# Patient Record
Sex: Female | Born: 1950
Health system: Southern US, Community
[De-identification: ages and names within clinical notes are randomized; demographics above are authoritative.]

## PROBLEM LIST (undated history)

## (undated) DIAGNOSIS — Z86018 Personal history of other benign neoplasm: Secondary | ICD-10-CM

## (undated) DIAGNOSIS — M5136 Other intervertebral disc degeneration, lumbar region: Secondary | ICD-10-CM

## (undated) DIAGNOSIS — M109 Gout, unspecified: Secondary | ICD-10-CM

## (undated) DIAGNOSIS — R7309 Other abnormal glucose: Secondary | ICD-10-CM

## (undated) DIAGNOSIS — L130 Dermatitis herpetiformis: Secondary | ICD-10-CM

## (undated) DIAGNOSIS — I10 Essential (primary) hypertension: Secondary | ICD-10-CM

## (undated) DIAGNOSIS — M51369 Other intervertebral disc degeneration, lumbar region without mention of lumbar back pain or lower extremity pain: Secondary | ICD-10-CM

## (undated) DIAGNOSIS — E7800 Pure hypercholesterolemia, unspecified: Secondary | ICD-10-CM

## (undated) DIAGNOSIS — F419 Anxiety disorder, unspecified: Secondary | ICD-10-CM

## (undated) DIAGNOSIS — F17200 Nicotine dependence, unspecified, uncomplicated: Secondary | ICD-10-CM

## (undated) DIAGNOSIS — H8110 Benign paroxysmal vertigo, unspecified ear: Secondary | ICD-10-CM

## (undated) HISTORY — PX: EYE SURGERY: SHX253

## (undated) HISTORY — PX: TONSILLECTOMY: SUR1361

## (undated) HISTORY — PX: JOINT REPLACEMENT: SHX530

## (undated) HISTORY — PX: ANTERIOR CRUCIATE LIGAMENT REPAIR: SHX115

---
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
## 1997-11-20 ENCOUNTER — Ambulatory Visit (HOSPITAL_COMMUNITY): Admission: RE | Admit: 1997-11-20 | Discharge: 1997-11-20 | Payer: Self-pay | Admitting: Internal Medicine

## 1999-05-21 ENCOUNTER — Other Ambulatory Visit: Admission: RE | Admit: 1999-05-21 | Discharge: 1999-05-21 | Payer: Self-pay | Admitting: Internal Medicine

## 1999-06-23 ENCOUNTER — Ambulatory Visit (HOSPITAL_COMMUNITY): Admission: RE | Admit: 1999-06-23 | Discharge: 1999-06-23 | Payer: Self-pay | Admitting: Internal Medicine

## 1999-06-23 ENCOUNTER — Encounter: Payer: Self-pay | Admitting: Internal Medicine

## 1999-06-30 ENCOUNTER — Encounter: Payer: Self-pay | Admitting: Internal Medicine

## 1999-06-30 ENCOUNTER — Ambulatory Visit (HOSPITAL_COMMUNITY): Admission: RE | Admit: 1999-06-30 | Discharge: 1999-06-30 | Payer: Self-pay | Admitting: Internal Medicine

## 1999-07-21 ENCOUNTER — Encounter: Payer: Self-pay | Admitting: Surgery

## 1999-07-21 ENCOUNTER — Ambulatory Visit (HOSPITAL_BASED_OUTPATIENT_CLINIC_OR_DEPARTMENT_OTHER): Admission: RE | Admit: 1999-07-21 | Discharge: 1999-07-21 | Payer: Self-pay | Admitting: Surgery

## 2001-01-12 ENCOUNTER — Ambulatory Visit (HOSPITAL_COMMUNITY): Admission: RE | Admit: 2001-01-12 | Discharge: 2001-01-12 | Payer: Self-pay | Admitting: Obstetrics and Gynecology

## 2001-01-12 ENCOUNTER — Encounter: Payer: Self-pay | Admitting: Obstetrics and Gynecology

## 2001-01-30 ENCOUNTER — Other Ambulatory Visit: Admission: RE | Admit: 2001-01-30 | Discharge: 2001-01-30 | Payer: Self-pay | Admitting: Obstetrics and Gynecology

## 2002-07-19 ENCOUNTER — Ambulatory Visit (HOSPITAL_COMMUNITY): Admission: RE | Admit: 2002-07-19 | Discharge: 2002-07-19 | Payer: Self-pay | Admitting: Obstetrics and Gynecology

## 2002-07-19 ENCOUNTER — Encounter: Payer: Self-pay | Admitting: Obstetrics and Gynecology

## 2003-01-23 ENCOUNTER — Encounter: Admission: RE | Admit: 2003-01-23 | Discharge: 2003-01-23 | Payer: Self-pay

## 2003-09-05 ENCOUNTER — Ambulatory Visit (HOSPITAL_COMMUNITY): Admission: RE | Admit: 2003-09-05 | Discharge: 2003-09-05 | Payer: Self-pay | Admitting: Obstetrics and Gynecology

## 2003-09-19 ENCOUNTER — Other Ambulatory Visit: Admission: RE | Admit: 2003-09-19 | Discharge: 2003-09-19 | Payer: Self-pay | Admitting: Obstetrics and Gynecology

## 2005-04-20 ENCOUNTER — Ambulatory Visit (HOSPITAL_COMMUNITY): Admission: RE | Admit: 2005-04-20 | Discharge: 2005-04-20 | Payer: Self-pay | Admitting: Obstetrics and Gynecology

## 2005-07-08 ENCOUNTER — Other Ambulatory Visit: Admission: RE | Admit: 2005-07-08 | Discharge: 2005-07-08 | Payer: Self-pay | Admitting: Obstetrics and Gynecology

## 2006-06-09 ENCOUNTER — Ambulatory Visit (HOSPITAL_COMMUNITY): Admission: RE | Admit: 2006-06-09 | Discharge: 2006-06-09 | Payer: Self-pay | Admitting: Cardiology

## 2006-06-21 ENCOUNTER — Ambulatory Visit (HOSPITAL_COMMUNITY): Admission: RE | Admit: 2006-06-21 | Discharge: 2006-06-21 | Payer: Self-pay | Admitting: Obstetrics and Gynecology

## 2007-02-17 ENCOUNTER — Encounter: Admission: RE | Admit: 2007-02-17 | Discharge: 2007-02-17 | Payer: Self-pay | Admitting: Cardiology

## 2008-05-07 ENCOUNTER — Ambulatory Visit (HOSPITAL_COMMUNITY): Admission: RE | Admit: 2008-05-07 | Discharge: 2008-05-07 | Payer: Self-pay | Admitting: Obstetrics and Gynecology

## 2010-07-15 ENCOUNTER — Ambulatory Visit (HOSPITAL_COMMUNITY)
Admission: RE | Admit: 2010-07-15 | Discharge: 2010-07-15 | Payer: Self-pay | Source: Home / Self Care | Attending: Obstetrics and Gynecology | Admitting: Obstetrics and Gynecology

## 2011-09-30 ENCOUNTER — Other Ambulatory Visit: Payer: Self-pay | Admitting: Obstetrics and Gynecology

## 2011-09-30 DIAGNOSIS — Z1231 Encounter for screening mammogram for malignant neoplasm of breast: Secondary | ICD-10-CM

## 2011-10-12 ENCOUNTER — Ambulatory Visit (HOSPITAL_COMMUNITY)
Admission: RE | Admit: 2011-10-12 | Discharge: 2011-10-12 | Disposition: A | Discharge status: Home / Self Care | Payer: BC Managed Care – PPO | Source: Ambulatory Visit | Attending: Obstetrics and Gynecology | Admitting: Obstetrics and Gynecology

## 2011-10-12 DIAGNOSIS — Z1231 Encounter for screening mammogram for malignant neoplasm of breast: Secondary | ICD-10-CM

## 2011-11-03 ENCOUNTER — Ambulatory Visit (INDEPENDENT_AMBULATORY_CARE_PROVIDER_SITE_OTHER): Payer: BC Managed Care – PPO | Admitting: Obstetrics and Gynecology

## 2011-11-03 DIAGNOSIS — Z01419 Encounter for gynecological examination (general) (routine) without abnormal findings: Secondary | ICD-10-CM

## 2012-04-26 ENCOUNTER — Other Ambulatory Visit: Payer: Self-pay | Admitting: Physician Assistant

## 2012-04-26 DIAGNOSIS — R911 Solitary pulmonary nodule: Secondary | ICD-10-CM

## 2012-05-01 ENCOUNTER — Ambulatory Visit
Admission: RE | Admit: 2012-05-01 | Discharge: 2012-05-01 | Disposition: A | Discharge status: Home / Self Care | Payer: BC Managed Care – PPO | Source: Ambulatory Visit | Attending: Physician Assistant | Admitting: Physician Assistant

## 2012-05-01 DIAGNOSIS — R911 Solitary pulmonary nodule: Secondary | ICD-10-CM

## 2012-05-01 MED ORDER — IOHEXOL 300 MG/ML  SOLN
75.0000 mL | Freq: Once | INTRAMUSCULAR | Status: AC | PRN
  Administered 2012-05-01: 75 mL via INTRAVENOUS

## 2012-12-11 DIAGNOSIS — E669 Obesity, unspecified: Secondary | ICD-10-CM | POA: Insufficient documentation

## 2012-12-12 ENCOUNTER — Other Ambulatory Visit: Payer: Self-pay | Admitting: Obstetrics and Gynecology

## 2012-12-12 DIAGNOSIS — Z1231 Encounter for screening mammogram for malignant neoplasm of breast: Secondary | ICD-10-CM

## 2012-12-14 ENCOUNTER — Ambulatory Visit (HOSPITAL_COMMUNITY)
Admission: RE | Admit: 2012-12-14 | Discharge: 2012-12-14 | Disposition: A | Discharge status: Home / Self Care | Payer: BC Managed Care – PPO | Source: Ambulatory Visit | Attending: Obstetrics and Gynecology | Admitting: Obstetrics and Gynecology

## 2012-12-14 DIAGNOSIS — Z1231 Encounter for screening mammogram for malignant neoplasm of breast: Secondary | ICD-10-CM | POA: Insufficient documentation

## 2013-12-19 ENCOUNTER — Other Ambulatory Visit: Payer: Self-pay | Admitting: Obstetrics and Gynecology

## 2013-12-19 DIAGNOSIS — Z1231 Encounter for screening mammogram for malignant neoplasm of breast: Secondary | ICD-10-CM

## 2014-01-02 ENCOUNTER — Ambulatory Visit (HOSPITAL_COMMUNITY)
Admission: RE | Admit: 2014-01-02 | Discharge: 2014-01-02 | Disposition: A | Discharge status: Home / Self Care | Payer: BC Managed Care – PPO | Source: Ambulatory Visit | Attending: Obstetrics and Gynecology | Admitting: Obstetrics and Gynecology

## 2014-01-02 DIAGNOSIS — Z1231 Encounter for screening mammogram for malignant neoplasm of breast: Secondary | ICD-10-CM | POA: Insufficient documentation

## 2015-03-27 ENCOUNTER — Encounter: Payer: Self-pay | Admitting: *Deleted

## 2015-03-28 ENCOUNTER — Ambulatory Visit: Payer: BLUE CROSS/BLUE SHIELD | Admitting: Anesthesiology

## 2015-03-28 ENCOUNTER — Encounter: Payer: Self-pay | Admitting: Gastroenterology

## 2015-03-28 ENCOUNTER — Encounter
Admission: RE | Disposition: A | Discharge status: Home / Self Care | Payer: Self-pay | Source: Ambulatory Visit | Attending: Gastroenterology

## 2015-03-28 ENCOUNTER — Ambulatory Visit
Admission: RE | Admit: 2015-03-28 | Discharge: 2015-03-28 | Disposition: A | Discharge status: Home / Self Care | Payer: BLUE CROSS/BLUE SHIELD | Source: Ambulatory Visit | Attending: Gastroenterology | Admitting: Gastroenterology

## 2015-03-28 DIAGNOSIS — Z881 Allergy status to other antibiotic agents status: Secondary | ICD-10-CM | POA: Insufficient documentation

## 2015-03-28 DIAGNOSIS — Z87891 Personal history of nicotine dependence: Secondary | ICD-10-CM | POA: Diagnosis not present

## 2015-03-28 DIAGNOSIS — Z1211 Encounter for screening for malignant neoplasm of colon: Secondary | ICD-10-CM | POA: Insufficient documentation

## 2015-03-28 DIAGNOSIS — K5730 Diverticulosis of large intestine without perforation or abscess without bleeding: Secondary | ICD-10-CM | POA: Insufficient documentation

## 2015-03-28 DIAGNOSIS — K6389 Other specified diseases of intestine: Secondary | ICD-10-CM | POA: Insufficient documentation

## 2015-03-28 DIAGNOSIS — E780 Pure hypercholesterolemia: Secondary | ICD-10-CM | POA: Diagnosis not present

## 2015-03-28 DIAGNOSIS — M109 Gout, unspecified: Secondary | ICD-10-CM | POA: Insufficient documentation

## 2015-03-28 DIAGNOSIS — I10 Essential (primary) hypertension: Secondary | ICD-10-CM | POA: Insufficient documentation

## 2015-03-28 HISTORY — DX: Other abnormal glucose: R73.09

## 2015-03-28 HISTORY — DX: Pure hypercholesterolemia, unspecified: E78.00

## 2015-03-28 HISTORY — DX: Essential (primary) hypertension: I10

## 2015-03-28 HISTORY — DX: Nicotine dependence, unspecified, uncomplicated: F17.200

## 2015-03-28 HISTORY — DX: Gout, unspecified: M10.9

## 2015-03-28 HISTORY — PX: COLONOSCOPY WITH PROPOFOL: SHX5780

## 2015-03-28 HISTORY — DX: Dermatitis herpetiformis: L13.0

## 2015-03-28 HISTORY — DX: Anxiety disorder, unspecified: F41.9

## 2015-03-28 LAB — GLUCOSE, CAPILLARY: GLUCOSE-CAPILLARY: 90 mg/dL (ref 65–99)

## 2015-03-28 SURGERY — COLONOSCOPY WITH PROPOFOL
Anesthesia: General

## 2015-03-28 MED ORDER — SODIUM CHLORIDE 0.9 % IV SOLN
INTRAVENOUS | Status: DC
  Administered 2015-03-28: 08:00:00 via INTRAVENOUS

## 2015-03-28 MED ORDER — PROPOFOL 10 MG/ML IV BOLUS
INTRAVENOUS | Status: DC | PRN
  Administered 2015-03-28: 50 mg via INTRAVENOUS

## 2015-03-28 MED ORDER — PROPOFOL INFUSION 10 MG/ML OPTIME
INTRAVENOUS | Status: DC | PRN
  Administered 2015-03-28: 120 ug/kg/min via INTRAVENOUS

## 2015-03-28 MED ORDER — PHENYLEPHRINE HCL 10 MG/ML IJ SOLN
INTRAMUSCULAR | Status: DC | PRN
  Administered 2015-03-28 (×2): 100 ug via INTRAVENOUS

## 2015-03-28 MED ORDER — LIDOCAINE HCL (CARDIAC) 20 MG/ML IV SOLN
INTRAVENOUS | Status: DC | PRN
  Administered 2015-03-28: 60 mg via INTRAVENOUS

## 2015-03-28 NOTE — H&P (Signed)
Outpatient short stay form Pre-procedure 03/28/2015 8:02 AM Lollie Sails MD  Primary Physician: Cyndi Bender, PA  Reason for visit:  Colonoscopy  History of present illness:  She is a 64 year old female presenting today for screening colonoscopy. Her last colonoscopy was in 2005 at that time showing no polyps. There is no family history of colon cancer or colon polyps. He tolerated her prep well. She denies use of any aspirin  products. She takes no blood thinners.    Current facility-administered medications:  .  0.9 %  sodium chloride infusion, , Intravenous, Continuous, Lollie Sails, MD, Last Rate: 20 mL/hr at 03/28/15 0758 .  0.9 %  sodium chloride infusion, , Intravenous, Continuous, Lollie Sails, MD  Prescriptions prior to admission  Medication Sig Dispense Refill Last Dose  . allopurinol (ZYLOPRIM) 100 MG tablet Take 100 mg by mouth daily.   03/27/2015 at 2030  . diltiazem (TIAZAC) 300 MG 24 hr capsule Take 300 mg by mouth daily.   03/27/2015 at 2030  . loratadine (CLARITIN REDITABS) 10 MG dissolvable tablet Take 10 mg by mouth daily.     . magnesium 30 MG tablet Take 30 mg by mouth 2 (two) times daily.        Allergies  Allergen Reactions  . Sulfa Antibiotics      Past Medical History  Diagnosis Date  . Hypertension   . Pure hypercholesterolemia   . Abnormal glucose   . Anxiety   . Gout   . Tobacco dependence   . Dermatitis herpetiformis     Review of systems:      Physical Exam    Heart and lungs: Regular rate and rhythm without rub or gallop lungs are bilaterally clear    HEENT: Normocephalic atraumatic eyes are anicteric    Other:     Pertinant exam for procedure: Soft nontender nondistended bowel sounds positive normoactive    Planned proceedures: Colonoscopy and indicated procedures I have discussed the risks benefits and complications of procedures to include not limited to bleeding, infection, perforation and the risk of sedation  and the patient wishes to proceed.    Lollie Sails, MD Gastroenterology 03/28/2015  8:02 AM

## 2015-03-28 NOTE — Anesthesia Postprocedure Evaluation (Signed)
  Anesthesia Post-op Note  Patient: Deborah Hayes  Procedure(s) Performed: Procedure(s): COLONOSCOPY WITH PROPOFOL (N/A)  Anesthesia type:General  Patient location: PACU  Post pain: Pain level controlled  Post assessment: Post-op Vital signs reviewed, Patient's Cardiovascular Status Stable, Respiratory Function Stable, Patent Airway and No signs of Nausea or vomiting  Post vital signs: Reviewed and stable  Last Vitals:  Filed Vitals:   03/28/15 0842  BP:   Pulse:   Temp: 36.4 C  Resp:     Level of consciousness: awake, alert  and patient cooperative  Complications: No apparent anesthesia complications

## 2015-03-28 NOTE — Anesthesia Preprocedure Evaluation (Signed)
Anesthesia Evaluation  Patient identified by MRN, date of birth, ID band Patient awake    Reviewed: Allergy & Precautions, NPO status , Patient's Chart, lab work & pertinent test results  History of Anesthesia Complications Negative for: history of anesthetic complications  Airway Mallampati: II  TM Distance: >3 FB Neck ROM: Full    Dental  (+) Implants, Partial Lower   Pulmonary former smoker (quit x 1 yr ),          Cardiovascular hypertension, Pt. on medications     Neuro/Psych PSYCHIATRIC DISORDERS (pt denies)    GI/Hepatic negative GI ROS, Neg liver ROS,   Endo/Other  negative endocrine ROS  Renal/GU negative Renal ROS  negative genitourinary   Musculoskeletal   Abdominal   Peds negative pediatric ROS (+)  Hematology negative hematology ROS (+)   Anesthesia Other Findings   Reproductive/Obstetrics                             Anesthesia Physical Anesthesia Plan  ASA: II  Anesthesia Plan: General   Post-op Pain Management:    Induction: Intravenous  Airway Management Planned:   Additional Equipment:   Intra-op Plan:   Post-operative Plan:   Informed Consent: I have reviewed the patients History and Physical, chart, labs and discussed the procedure including the risks, benefits and alternatives for the proposed anesthesia with the patient or authorized representative who has indicated his/her understanding and acceptance.     Plan Discussed with:   Anesthesia Plan Comments:         Anesthesia Quick Evaluation

## 2015-03-28 NOTE — Transfer of Care (Signed)
Immediate Anesthesia Transfer of Care Note  Patient: Deborah Hayes  Procedure(s) Performed: Procedure(s): COLONOSCOPY WITH PROPOFOL (N/A)  Patient Location: endoscopy unit  Anesthesia Type:General  Level of Consciousness: awake, alert , oriented and patient cooperative  Airway & Oxygen Therapy: Patient Spontanous Breathing and Patient connected to nasal cannula oxygen  Post-op Assessment: Report given to RN, Post -op Vital signs reviewed and stable and Patient moving all extremities X 4  Post vital signs: Reviewed and stable  Last Vitals:  Filed Vitals:   03/28/15 0841  BP: 105/68  Pulse: 72  Temp: 36.4 C  Resp: 18    Complications: No apparent anesthesia complications

## 2015-04-01 ENCOUNTER — Other Ambulatory Visit: Payer: Self-pay | Admitting: Gastroenterology

## 2015-04-01 DIAGNOSIS — K5730 Diverticulosis of large intestine without perforation or abscess without bleeding: Secondary | ICD-10-CM

## 2015-04-01 NOTE — Op Note (Signed)
[facility] Gastroenterology Patient Name: Deborah Hayes Procedure Date: 03/28/2015 7:55 AM MRN: [id number] Account #: [id number] Date of Birth: April 28, 1951 Admit Type: Outpatient Age: 64 Room: [facility] ENDO ROOM 3 Gender: Female Note Status: Finalized Procedure:         Colonoscopy Indications:       Screening for colorectal malignant neoplasm Providers:         Lollie Sails, MD Referring MD:      Galen Daft. Deforest Hoyles, MD (Referring MD) Medicines:         Monitored Anesthesia Care Complications:     No immediate complications. Procedure:         Pre-Anesthesia Assessment:                    - ASA Grade Assessment: II - A patient with mild systemic                     disease.                    After obtaining informed consent, the colonoscope was                     passed under direct vision. Throughout the procedure, the                     patient's blood pressure, pulse, and oxygen saturations                     were monitored continuously. The Colonoscope was                     introduced through the anus and advanced to the the                     hepatic flexure. The colonoscopy was unusually difficult                     due to multiple diverticula in the colon, poor bowel prep                     with stool present and a tortuous colon. Successful                     completion of the procedure was aided by lavage. Findings:      Many small and large-mouthed diverticula were found in the entire colon.      The scope was introduced to its full length twice and I was unable to       move to the cecum despite multiple reduction maneuvers. Impression:        - Diverticulosis in the entire examined colon.                    - No specimens collected. Recommendation:    - Perform an air contrast barium enema at appointment to                     be scheduled. Procedure Code(s): --- Professional ---                    [id number], 53, Colonoscopy,  flexible; diagnostic, including                     collection of specimen(s) by brushing or  washing, when                     performed (separate procedure) Diagnosis Code(s): --- Professional ---                    V76.51, Special screening for malignant neoplasms of colon                    562.10, Diverticulosis of colon (without mention of                     hemorrhage) CPT copyright 2014 American Medical Association. All rights reserved. The codes documented in this report are preliminary and upon coder review may  be revised to meet current compliance requirements. Lollie Sails, MD 04/01/2015 5:53:16 PM This report has been signed electronically. Number of Addenda: 0 Note Initiated On: 03/28/2015 7:55 AM Total Procedure Duration: 0 hours 23 minutes 1 second       [facility]

## 2015-04-04 ENCOUNTER — Other Ambulatory Visit (HOSPITAL_COMMUNITY): Payer: Self-pay | Admitting: Obstetrics and Gynecology

## 2015-04-04 DIAGNOSIS — Z1231 Encounter for screening mammogram for malignant neoplasm of breast: Secondary | ICD-10-CM

## 2015-04-08 ENCOUNTER — Ambulatory Visit (HOSPITAL_COMMUNITY)
Admission: RE | Admit: 2015-04-08 | Discharge: 2015-04-08 | Disposition: A | Discharge status: Home / Self Care | Payer: BLUE CROSS/BLUE SHIELD | Source: Ambulatory Visit | Attending: Obstetrics and Gynecology | Admitting: Obstetrics and Gynecology

## 2015-04-08 DIAGNOSIS — Z1231 Encounter for screening mammogram for malignant neoplasm of breast: Secondary | ICD-10-CM | POA: Diagnosis present

## 2015-04-10 ENCOUNTER — Other Ambulatory Visit: Payer: Self-pay | Admitting: Gastroenterology

## 2015-04-10 ENCOUNTER — Ambulatory Visit
Admission: RE | Admit: 2015-04-10 | Discharge: 2015-04-10 | Disposition: A | Discharge status: Home / Self Care | Payer: BLUE CROSS/BLUE SHIELD | Source: Ambulatory Visit | Attending: Gastroenterology | Admitting: Gastroenterology

## 2015-04-10 DIAGNOSIS — K5730 Diverticulosis of large intestine without perforation or abscess without bleeding: Secondary | ICD-10-CM | POA: Diagnosis present

## 2015-04-11 ENCOUNTER — Ambulatory Visit
Admission: RE | Admit: 2015-04-11 | Discharge: 2015-04-11 | Disposition: A | Discharge status: Home / Self Care | Payer: BLUE CROSS/BLUE SHIELD | Source: Ambulatory Visit | Attending: Gastroenterology | Admitting: Gastroenterology

## 2015-04-11 DIAGNOSIS — K5730 Diverticulosis of large intestine without perforation or abscess without bleeding: Secondary | ICD-10-CM

## 2015-05-03 DIAGNOSIS — Z86018 Personal history of other benign neoplasm: Secondary | ICD-10-CM

## 2015-05-03 HISTORY — DX: Personal history of other benign neoplasm: Z86.018

## 2016-08-24 ENCOUNTER — Telehealth: Payer: Self-pay | Admitting: *Deleted

## 2016-08-24 DIAGNOSIS — Z87891 Personal history of nicotine dependence: Secondary | ICD-10-CM

## 2016-08-24 NOTE — Telephone Encounter (Signed)
Received referral for initial lung cancer screening scan. Contacted patient and obtained smoking history,(former, quit 04/2014, 47 pack year) as well as answering questions related to screening process. Patient denies signs of lung cancer such as weight loss or hemoptysis. Patient denies comorbidity that would prevent curative treatment if lung cancer were found. Patient is tentatively scheduled for shared decision making visit and CT scan on 08/31/16, pending insurance approval from business office.

## 2016-08-25 DIAGNOSIS — Z87891 Personal history of nicotine dependence: Secondary | ICD-10-CM | POA: Insufficient documentation

## 2016-08-25 NOTE — Progress Notes (Signed)
In accordance with CMS guidelines, patient has met eligibility criteria including age, absence of signs or symptoms of lung cancer.  Social History  Substance Use Topics  . Smoking status: Former Smoker    Packs/day: 1.00    Years: 47.00    Types: Cigarettes    Quit date: 04/2014  . Smokeless tobacco: Not on file  . Alcohol use Not on file     A shared decision-making session was conducted prior to the performance of CT scan. This includes one or more decision aids, includes benefits and harms of screening, follow-up diagnostic testing, over-diagnosis, false positive rate, and total radiation exposure.  Counseling on the importance of adherence to annual lung cancer LDCT screening, impact of co-morbidities, and ability or willingness to undergo diagnosis and treatment is imperative for compliance of the program.  Counseling on the importance of continued smoking cessation for former smokers; the importance of smoking cessation for current smokers, and information about tobacco cessation interventions have been given to patient including [place] and [facility] quit [place] programs.  Written order for lung cancer screening with LDCT has been given to the patient and any and all questions have been answered to the best of my abilities.   Yearly follow up will be coordinated by Burgess Estelle, Thoracic Navigator.

## 2016-08-31 ENCOUNTER — Ambulatory Visit
Admission: RE | Admit: 2016-08-31 | Discharge: 2016-08-31 | Disposition: A | Discharge status: Home / Self Care | Payer: Medicare Other | Source: Ambulatory Visit | Attending: Oncology | Admitting: Oncology

## 2016-08-31 ENCOUNTER — Inpatient Hospital Stay: Payer: Medicare Other | Attending: Oncology | Admitting: Oncology

## 2016-08-31 ENCOUNTER — Encounter: Payer: Self-pay | Admitting: Oncology

## 2016-08-31 ENCOUNTER — Encounter (INDEPENDENT_AMBULATORY_CARE_PROVIDER_SITE_OTHER): Payer: Self-pay

## 2016-08-31 DIAGNOSIS — I2510 Atherosclerotic heart disease of native coronary artery without angina pectoris: Secondary | ICD-10-CM | POA: Diagnosis not present

## 2016-08-31 DIAGNOSIS — Z87891 Personal history of nicotine dependence: Secondary | ICD-10-CM | POA: Diagnosis not present

## 2016-08-31 DIAGNOSIS — Z122 Encounter for screening for malignant neoplasm of respiratory organs: Secondary | ICD-10-CM | POA: Diagnosis not present

## 2016-08-31 DIAGNOSIS — I700 Atherosclerosis of aorta: Secondary | ICD-10-CM | POA: Insufficient documentation

## 2016-08-31 DIAGNOSIS — R918 Other nonspecific abnormal finding of lung field: Secondary | ICD-10-CM | POA: Insufficient documentation

## 2016-08-31 DIAGNOSIS — J432 Centrilobular emphysema: Secondary | ICD-10-CM | POA: Diagnosis not present

## 2016-09-03 ENCOUNTER — Encounter: Payer: Self-pay | Admitting: *Deleted

## 2017-03-23 ENCOUNTER — Other Ambulatory Visit: Payer: Self-pay | Admitting: Physician Assistant

## 2017-03-23 DIAGNOSIS — E2839 Other primary ovarian failure: Secondary | ICD-10-CM

## 2017-05-04 ENCOUNTER — Ambulatory Visit
Admission: RE | Admit: 2017-05-04 | Discharge: 2017-05-04 | Disposition: A | Discharge status: Home / Self Care | Payer: Medicare Other | Source: Ambulatory Visit | Attending: Physician Assistant | Admitting: Physician Assistant

## 2017-05-04 DIAGNOSIS — E2839 Other primary ovarian failure: Secondary | ICD-10-CM | POA: Insufficient documentation

## 2017-08-24 ENCOUNTER — Telehealth: Payer: Self-pay | Admitting: *Deleted

## 2017-08-24 DIAGNOSIS — Z122 Encounter for screening for malignant neoplasm of respiratory organs: Secondary | ICD-10-CM

## 2017-08-24 DIAGNOSIS — Z87891 Personal history of nicotine dependence: Secondary | ICD-10-CM

## 2017-08-24 NOTE — Telephone Encounter (Signed)
Notified patient that annual lung cancer screening low dose CT scan is due currently or will be in near future. Confirmed that patient is within the age range of 55-77, and asymptomatic, (no signs or symptoms of lung cancer). Patient denies illness that would prevent curative treatment for lung cancer if found. Verified smoking history, (former, quit 8/15. 47 pack year). The shared decision making visit was done 08/31/16. Patient is agreeable for CT scan being scheduled.

## 2017-09-01 ENCOUNTER — Ambulatory Visit
Admission: RE | Admit: 2017-09-01 | Discharge: 2017-09-01 | Disposition: A | Discharge status: Home / Self Care | Payer: Medicare Other | Source: Ambulatory Visit | Attending: Nurse Practitioner | Admitting: Nurse Practitioner

## 2017-09-01 DIAGNOSIS — I2510 Atherosclerotic heart disease of native coronary artery without angina pectoris: Secondary | ICD-10-CM | POA: Insufficient documentation

## 2017-09-01 DIAGNOSIS — J432 Centrilobular emphysema: Secondary | ICD-10-CM | POA: Diagnosis not present

## 2017-09-01 DIAGNOSIS — Z122 Encounter for screening for malignant neoplasm of respiratory organs: Secondary | ICD-10-CM | POA: Diagnosis not present

## 2017-09-01 DIAGNOSIS — Z87891 Personal history of nicotine dependence: Secondary | ICD-10-CM | POA: Diagnosis present

## 2017-09-01 DIAGNOSIS — J438 Other emphysema: Secondary | ICD-10-CM | POA: Diagnosis not present

## 2017-09-01 DIAGNOSIS — I700 Atherosclerosis of aorta: Secondary | ICD-10-CM | POA: Insufficient documentation

## 2017-09-05 ENCOUNTER — Encounter: Payer: Self-pay | Admitting: *Deleted

## 2018-08-02 HISTORY — PX: TOTAL HIP ARTHROPLASTY: SHX124

## 2018-08-18 ENCOUNTER — Telehealth: Payer: Self-pay

## 2018-08-18 NOTE — Telephone Encounter (Signed)
Call pt regarding lung screening. Left message for pt to return call.  

## 2018-08-22 ENCOUNTER — Telehealth: Payer: Self-pay | Admitting: *Deleted

## 2018-08-22 NOTE — Telephone Encounter (Signed)
Attempted to contact patient r/t LDCT Screening follow up due at this time.  No answer received, message left for patient to call [phone] to schedule appointment.    

## 2018-08-23 ENCOUNTER — Telehealth: Payer: Self-pay | Admitting: *Deleted

## 2018-08-23 DIAGNOSIS — Z122 Encounter for screening for malignant neoplasm of respiratory organs: Secondary | ICD-10-CM

## 2018-08-23 NOTE — Telephone Encounter (Signed)
Patient has been notified that the annual lung cancer screening low dose CT scan is due currently or will be in the near future.  Confirmed that the patient is within the age range of 68-80, and asymptomatic, and currently exhibits no signs or symptoms of lung cancer.  Patient denies illness that would prevent curative treatment for lung cancer if found.  Verified smoking history, former smoker quit 2015 with a 47pkyr history.  The shared decision making visit was completed on 1-+30-18.  Patient is agreeable for the CT scan to be scheduled.  Will call patient back with date and time of appointment.

## 2018-08-25 ENCOUNTER — Telehealth: Payer: Self-pay | Admitting: *Deleted

## 2018-08-25 NOTE — Telephone Encounter (Signed)
Called pt to inform her of her appt for ldct screening @ [facility] on Thursday 09/07/2018 @ 10:40am, voiced understanding.  Mailed appt to patient.

## 2018-09-07 ENCOUNTER — Ambulatory Visit
Admission: RE | Admit: 2018-09-07 | Discharge: 2018-09-07 | Disposition: A | Discharge status: Home / Self Care | Payer: Medicare Other | Source: Ambulatory Visit | Attending: Oncology | Admitting: Oncology

## 2018-09-07 ENCOUNTER — Ambulatory Visit: Admission: RE | Admit: 2018-09-07 | Payer: Medicare Other | Source: Ambulatory Visit

## 2018-09-07 DIAGNOSIS — Z122 Encounter for screening for malignant neoplasm of respiratory organs: Secondary | ICD-10-CM | POA: Diagnosis not present

## 2018-09-08 ENCOUNTER — Encounter: Payer: Self-pay | Admitting: *Deleted

## 2018-09-23 ENCOUNTER — Encounter: Payer: Self-pay | Admitting: *Deleted

## 2018-09-29 ENCOUNTER — Encounter: Payer: Self-pay | Admitting: *Deleted

## 2018-10-05 DIAGNOSIS — M1611 Unilateral primary osteoarthritis, right hip: Secondary | ICD-10-CM | POA: Insufficient documentation

## 2019-01-02 DIAGNOSIS — Z96649 Presence of unspecified artificial hip joint: Secondary | ICD-10-CM | POA: Insufficient documentation

## 2019-09-10 ENCOUNTER — Telehealth: Payer: Self-pay | Admitting: *Deleted

## 2019-09-10 DIAGNOSIS — Z87891 Personal history of nicotine dependence: Secondary | ICD-10-CM

## 2019-09-10 NOTE — Telephone Encounter (Signed)
Patient has been notified that annual lung cancer screening low dose CT scan is due currently or will be in near future. Confirmed that patient is within the age range of 55-77, and asymptomatic, (no signs or symptoms of lung cancer). Patient denies illness that would prevent curative treatment for lung cancer if found. Verified smoking history, (former, quit 2015, 47 pack year). The shared decision making visit was done 08/31/16. Patient is agreeable for CT scan being scheduled.

## 2019-09-13 ENCOUNTER — Other Ambulatory Visit: Payer: Self-pay

## 2019-09-13 ENCOUNTER — Ambulatory Visit
Admission: RE | Admit: 2019-09-13 | Discharge: 2019-09-13 | Disposition: A | Discharge status: Home / Self Care | Payer: Medicare PPO | Source: Ambulatory Visit | Attending: Nurse Practitioner | Admitting: Nurse Practitioner

## 2019-09-13 DIAGNOSIS — Z87891 Personal history of nicotine dependence: Secondary | ICD-10-CM | POA: Insufficient documentation

## 2019-09-18 ENCOUNTER — Encounter: Payer: Self-pay | Admitting: *Deleted

## 2019-11-29 DIAGNOSIS — Z13820 Encounter for screening for osteoporosis: Secondary | ICD-10-CM | POA: Diagnosis not present

## 2019-11-29 DIAGNOSIS — Z1231 Encounter for screening mammogram for malignant neoplasm of breast: Secondary | ICD-10-CM | POA: Diagnosis not present

## 2019-11-29 DIAGNOSIS — Z1239 Encounter for other screening for malignant neoplasm of breast: Secondary | ICD-10-CM | POA: Diagnosis not present

## 2019-11-29 DIAGNOSIS — Z01419 Encounter for gynecological examination (general) (routine) without abnormal findings: Secondary | ICD-10-CM | POA: Diagnosis not present

## 2020-02-21 DIAGNOSIS — Z23 Encounter for immunization: Secondary | ICD-10-CM | POA: Diagnosis not present

## 2020-02-21 DIAGNOSIS — E7800 Pure hypercholesterolemia, unspecified: Secondary | ICD-10-CM | POA: Diagnosis not present

## 2020-02-21 DIAGNOSIS — Z6841 Body Mass Index (BMI) 40.0 and over, adult: Secondary | ICD-10-CM | POA: Diagnosis not present

## 2020-02-21 DIAGNOSIS — I10 Essential (primary) hypertension: Secondary | ICD-10-CM | POA: Diagnosis not present

## 2020-02-21 DIAGNOSIS — R7303 Prediabetes: Secondary | ICD-10-CM | POA: Diagnosis not present

## 2020-02-21 DIAGNOSIS — M109 Gout, unspecified: Secondary | ICD-10-CM | POA: Diagnosis not present

## 2020-02-21 DIAGNOSIS — I2510 Atherosclerotic heart disease of native coronary artery without angina pectoris: Secondary | ICD-10-CM | POA: Diagnosis not present

## 2020-04-14 DIAGNOSIS — Z0000 Encounter for general adult medical examination without abnormal findings: Secondary | ICD-10-CM | POA: Diagnosis not present

## 2020-04-14 DIAGNOSIS — E785 Hyperlipidemia, unspecified: Secondary | ICD-10-CM | POA: Diagnosis not present

## 2020-04-14 DIAGNOSIS — Z1331 Encounter for screening for depression: Secondary | ICD-10-CM | POA: Diagnosis not present

## 2020-04-14 DIAGNOSIS — Z6841 Body Mass Index (BMI) 40.0 and over, adult: Secondary | ICD-10-CM | POA: Diagnosis not present

## 2020-04-14 DIAGNOSIS — Z9181 History of falling: Secondary | ICD-10-CM | POA: Diagnosis not present

## 2020-05-01 DIAGNOSIS — H2511 Age-related nuclear cataract, right eye: Secondary | ICD-10-CM | POA: Diagnosis not present

## 2020-05-23 DIAGNOSIS — H25011 Cortical age-related cataract, right eye: Secondary | ICD-10-CM | POA: Diagnosis not present

## 2020-05-23 DIAGNOSIS — I10 Essential (primary) hypertension: Secondary | ICD-10-CM | POA: Diagnosis not present

## 2020-05-28 ENCOUNTER — Other Ambulatory Visit: Payer: Self-pay

## 2020-05-28 ENCOUNTER — Encounter: Payer: Self-pay | Admitting: Ophthalmology

## 2020-05-30 ENCOUNTER — Other Ambulatory Visit
Admission: RE | Admit: 2020-05-30 | Discharge: 2020-05-30 | Disposition: A | Discharge status: Home / Self Care | Payer: Medicare PPO | Source: Ambulatory Visit | Attending: Ophthalmology | Admitting: Ophthalmology

## 2020-05-30 ENCOUNTER — Other Ambulatory Visit: Payer: Self-pay

## 2020-05-30 DIAGNOSIS — Z20822 Contact with and (suspected) exposure to covid-19: Secondary | ICD-10-CM | POA: Diagnosis not present

## 2020-05-30 DIAGNOSIS — Z01812 Encounter for preprocedural laboratory examination: Secondary | ICD-10-CM | POA: Diagnosis not present

## 2020-05-31 LAB — SARS CORONAVIRUS 2 (TAT 6-24 HRS): SARS Coronavirus 2: NEGATIVE

## 2020-06-02 NOTE — Discharge Instructions (Signed)

## 2020-06-03 ENCOUNTER — Ambulatory Visit: Payer: Medicare PPO | Admitting: Anesthesiology

## 2020-06-03 ENCOUNTER — Other Ambulatory Visit: Payer: Self-pay

## 2020-06-03 ENCOUNTER — Encounter: Payer: Self-pay | Admitting: Ophthalmology

## 2020-06-03 ENCOUNTER — Ambulatory Visit
Admission: RE | Admit: 2020-06-03 | Discharge: 2020-06-03 | Disposition: A | Discharge status: Home / Self Care | Payer: Medicare PPO | Attending: Ophthalmology | Admitting: Ophthalmology

## 2020-06-03 ENCOUNTER — Encounter
Admission: RE | Disposition: A | Discharge status: Home / Self Care | Payer: Self-pay | Source: Home / Self Care | Attending: Ophthalmology

## 2020-06-03 DIAGNOSIS — Z79899 Other long term (current) drug therapy: Secondary | ICD-10-CM | POA: Diagnosis not present

## 2020-06-03 DIAGNOSIS — H25011 Cortical age-related cataract, right eye: Secondary | ICD-10-CM | POA: Diagnosis not present

## 2020-06-03 DIAGNOSIS — Z7982 Long term (current) use of aspirin: Secondary | ICD-10-CM | POA: Diagnosis not present

## 2020-06-03 DIAGNOSIS — E7800 Pure hypercholesterolemia, unspecified: Secondary | ICD-10-CM | POA: Insufficient documentation

## 2020-06-03 DIAGNOSIS — J449 Chronic obstructive pulmonary disease, unspecified: Secondary | ICD-10-CM | POA: Insufficient documentation

## 2020-06-03 DIAGNOSIS — I10 Essential (primary) hypertension: Secondary | ICD-10-CM | POA: Diagnosis not present

## 2020-06-03 DIAGNOSIS — Z87891 Personal history of nicotine dependence: Secondary | ICD-10-CM | POA: Diagnosis not present

## 2020-06-03 DIAGNOSIS — H25811 Combined forms of age-related cataract, right eye: Secondary | ICD-10-CM | POA: Diagnosis not present

## 2020-06-03 DIAGNOSIS — H2511 Age-related nuclear cataract, right eye: Secondary | ICD-10-CM | POA: Diagnosis not present

## 2020-06-03 HISTORY — DX: Benign paroxysmal vertigo, unspecified ear: H81.10

## 2020-06-03 HISTORY — PX: CATARACT EXTRACTION W/PHACO: SHX586

## 2020-06-03 HISTORY — DX: Other intervertebral disc degeneration, lumbar region without mention of lumbar back pain or lower extremity pain: M51.369

## 2020-06-03 HISTORY — DX: Other intervertebral disc degeneration, lumbar region: M51.36

## 2020-06-03 HISTORY — PX: CATARACT EXTRACTION: SUR2

## 2020-06-03 SURGERY — PHACOEMULSIFICATION, CATARACT, WITH IOL INSERTION
Anesthesia: Monitor Anesthesia Care | Site: Eye | Laterality: Right

## 2020-06-03 MED ORDER — BRIMONIDINE TARTRATE-TIMOLOL 0.2-0.5 % OP SOLN
OPHTHALMIC | Status: DC | PRN
  Administered 2020-06-03: 1 [drp] via OPHTHALMIC

## 2020-06-03 MED ORDER — LIDOCAINE HCL (PF) 2 % IJ SOLN
INTRAOCULAR | Status: DC | PRN
  Administered 2020-06-03: 1 mL

## 2020-06-03 MED ORDER — LACTATED RINGERS IV SOLN: INTRAVENOUS | Status: DC

## 2020-06-03 MED ORDER — TETRACAINE HCL 0.5 % OP SOLN
1.0000 [drp] | OPHTHALMIC | Status: DC | PRN
  Administered 2020-06-03 (×3): 1 [drp] via OPHTHALMIC

## 2020-06-03 MED ORDER — ARMC OPHTHALMIC DILATING DROPS
1.0000 "application " | OPHTHALMIC | Status: DC | PRN
  Administered 2020-06-03 (×3): 1 via OPHTHALMIC

## 2020-06-03 MED ORDER — MOXIFLOXACIN HCL 0.5 % OP SOLN
OPHTHALMIC | Status: DC | PRN
  Administered 2020-06-03: 0.2 mL via OPHTHALMIC

## 2020-06-03 MED ORDER — NA CHONDROIT SULF-NA HYALURON 40-17 MG/ML IO SOLN
INTRAOCULAR | Status: DC | PRN
  Administered 2020-06-03: 1 mL via INTRAOCULAR

## 2020-06-03 MED ORDER — EPINEPHRINE PF 1 MG/ML IJ SOLN
INTRAOCULAR | Status: DC | PRN
  Administered 2020-06-03: 60 mL via OPHTHALMIC

## 2020-06-03 MED ORDER — MIDAZOLAM HCL 2 MG/2ML IJ SOLN
INTRAMUSCULAR | Status: DC | PRN
  Administered 2020-06-03 (×2): 1 mg via INTRAVENOUS

## 2020-06-03 MED ORDER — FENTANYL CITRATE (PF) 100 MCG/2ML IJ SOLN
INTRAMUSCULAR | Status: DC | PRN
  Administered 2020-06-03 (×2): 50 ug via INTRAVENOUS

## 2020-06-03 SURGICAL SUPPLY — 19 items
CANNULA ANT/CHMB 27GA (MISCELLANEOUS) ×6 IMPLANT
GLOVE SURG LX 8.0 MICRO (GLOVE) ×2
GLOVE SURG LX STRL 8.0 MICRO (GLOVE) ×1 IMPLANT
GLOVE SURG TRIUMPH 8.0 PF LTX (GLOVE) ×3 IMPLANT
GOWN STRL REUS W/ TWL LRG LVL3 (GOWN DISPOSABLE) ×2 IMPLANT
GOWN STRL REUS W/TWL LRG LVL3 (GOWN DISPOSABLE) ×6
LENS IOL EYHANCE TORIC II 24.5 ×3 IMPLANT
LENS IOL EYHANCE TRC 375 24.5 ×1 IMPLANT
LENS IOL EYHNC TORIC 375 24.5 ×1 IMPLANT
MARKER SKIN DUAL TIP RULER LAB (MISCELLANEOUS) ×3 IMPLANT
NEEDLE FILTER BLUNT 18X 1/2SAF (NEEDLE) ×2
NEEDLE FILTER BLUNT 18X1 1/2 (NEEDLE) ×1 IMPLANT
PACK EYE AFTER SURG (MISCELLANEOUS) ×3 IMPLANT
PACK OPTHALMIC (MISCELLANEOUS) ×3 IMPLANT
PACK PORFILIO (MISCELLANEOUS) ×3 IMPLANT
SYR 3ML LL SCALE MARK (SYRINGE) ×3 IMPLANT
SYR TB 1ML LUER SLIP (SYRINGE) ×3 IMPLANT
WATER STERILE IRR 250ML POUR (IV SOLUTION) ×3 IMPLANT
WIPE NON LINTING 3.25X3.25 (MISCELLANEOUS) ×3 IMPLANT

## 2020-06-03 NOTE — Anesthesia Procedure Notes (Signed)
Procedure Name: MAC Date/Time: 06/03/2020 10:11 AM Performed by: Silvana Newness, CRNA Pre-anesthesia Checklist: Patient identified, Emergency Drugs available, Suction available, Patient being monitored and Timeout performed Patient Re-evaluated:Patient Re-evaluated prior to induction Oxygen Delivery Method: Nasal cannula Placement Confirmation: positive ETCO2

## 2020-06-03 NOTE — Anesthesia Preprocedure Evaluation (Signed)
Anesthesia Evaluation  Patient identified by MRN, date of birth, ID band Patient awake    Reviewed: Allergy & Precautions, H&P , NPO status , Patient's Chart, lab work & pertinent test results  Airway Mallampati: II  TM Distance: >3 FB Neck ROM: full    Dental no notable dental hx.    Pulmonary former smoker,    Pulmonary exam normal breath sounds clear to auscultation       Cardiovascular hypertension, Normal cardiovascular exam Rhythm:regular Rate:Normal     Neuro/Psych    GI/Hepatic   Endo/Other  Morbid obesity  Renal/GU      Musculoskeletal   Abdominal   Peds  Hematology   Anesthesia Other Findings   Reproductive/Obstetrics                             Anesthesia Physical Anesthesia Plan  ASA: II  Anesthesia Plan: MAC   Post-op Pain Management:    Induction:   PONV Risk Score and Plan: 2 and Treatment may vary due to age or medical condition, TIVA and Midazolam  Airway Management Planned:   Additional Equipment:   Intra-op Plan:   Post-operative Plan:   Informed Consent: I have reviewed the patients History and Physical, chart, labs and discussed the procedure including the risks, benefits and alternatives for the proposed anesthesia with the patient or authorized representative who has indicated his/her understanding and acceptance.     Dental Advisory Given  Plan Discussed with: CRNA  Anesthesia Plan Comments:         Anesthesia Quick Evaluation

## 2020-06-03 NOTE — Anesthesia Postprocedure Evaluation (Signed)
Anesthesia Post Note  Patient: Deborah Hayes  Procedure(s) Performed: CATARACT EXTRACTION PHACO AND INTRAOCULAR LENS PLACEMENT (IOC) RIGHT 4.09 00:29.3 (Right Eye)     Patient location during evaluation: PACU Anesthesia Type: MAC Level of consciousness: awake and alert and oriented Pain management: satisfactory to patient Vital Signs Assessment: post-procedure vital signs reviewed and stable Respiratory status: spontaneous breathing, nonlabored ventilation and respiratory function stable Cardiovascular status: blood pressure returned to baseline and stable Postop Assessment: Adequate PO intake and No signs of nausea or vomiting Anesthetic complications: no   No complications documented.  Raliegh Ip

## 2020-06-03 NOTE — H&P (Signed)
[facility]   Primary Care Physician:  Cyndi Bender, PA-C Ophthalmologist: Dr. George Ina  Pre-Procedure History & Physical: HPI:  Deborah ESKIN is a 69 y.o. female here for cataract surgery.   Past Medical History:  Diagnosis Date  . Abnormal glucose   . Anxiety   . BPPV (benign paroxysmal positional vertigo)    none for several years  . Degenerative disc disease, lumbar   . Dermatitis herpetiformis   . Gout   . Hx of dysplastic nevus 05/2015   L buttock, mild atypia; L inferior buttock, moderate atypia  . Hypertension   . Pure hypercholesterolemia   . Tobacco dependence    H/O    Past Surgical History:  Procedure Laterality Date  . ANTERIOR CRUCIATE LIGAMENT REPAIR Left   . COLONOSCOPY WITH PROPOFOL N/A 03/28/2015   Procedure: COLONOSCOPY WITH PROPOFOL;  Surgeon: Lollie Sails, MD;  Location: [facility] ENDOSCOPY;  Service: Endoscopy;  Laterality: N/A;  . TOTAL HIP ARTHROPLASTY Right 2020    Prior to Admission medications   Medication Sig Start Date End Date Taking? Authorizing Provider  allopurinol (ZYLOPRIM) 100 MG tablet Take 300 mg by mouth daily.    Yes [provider]  Ascorbic Acid (VITAMIN C PO) Take by mouth.   Yes [provider]  ASPIRIN 81 PO Take by mouth daily.   Yes [provider]  atorvastatin (LIPITOR) 20 MG tablet Take 20 mg by mouth daily.   Yes [provider]  loratadine (CLARITIN REDITABS) 10 MG dissolvable tablet Take 10 mg by mouth daily.   Yes [provider]  losartan (COZAAR) 50 MG tablet Take 50 mg by mouth 2 (two) times daily.   Yes [provider]  metoprolol succinate (TOPROL-XL) 25 MG 24 hr tablet Take 25 mg by mouth daily.   Yes [provider]  Multiple Vitamins-Minerals (CENTRUM SILVER PO) Take by mouth daily.   Yes [provider]  Multiple Vitamins-Minerals (ZINC PO) Take by mouth.   Yes [provider]  Omega-3 Fatty Acids (OMEGA-3 FISH OIL PO)  Take 600 mg by mouth daily.   Yes [provider]  vitamin B-12 (CYANOCOBALAMIN) 1000 MCG tablet Take 1,000 mcg by mouth daily.   Yes [provider]    Allergies as of 05/02/2020 - Review Complete 12/12/2018  Allergen Reaction Noted  . Sulfa antibiotics  03/27/2015    History reviewed. No pertinent family history.  Social History   Socioeconomic History  . Marital status: Married    Spouse name: Not on file  . Number of children: Not on file  . Years of education: Not on file  . Highest education level: Not on file  Occupational History  . Not on file  Tobacco Use  . Smoking status: Former Smoker    Packs/day: 1.00    Years: 47.00    Pack years: 47.00    Types: Cigarettes    Quit date: 04/2014    Years since quitting: 6.1  . Smokeless tobacco: Never Used  Vaping Use  . Vaping Use: Never used  Substance and Sexual Activity  . Alcohol use: Yes    Alcohol/week: 5.0 standard drinks    Types: 5 Glasses of wine per week  . Drug use: Not on file  . Sexual activity: Not on file  Other Topics Concern  . Not on file  Social History Narrative  . Not on file   Social Determinants of Health   Financial Resource Strain:   . Difficulty  of Paying Living Expenses: Not on file  Food Insecurity:   . Worried About Charity fundraiser in the Last Year: Not on file  . Ran Out of Food in the Last Year: Not on file  Transportation Needs:   . Lack of Transportation (Medical): Not on file  . Lack of Transportation (Non-Medical): Not on file  Physical Activity:   . Days of Exercise per Week: Not on file  . Minutes of Exercise per Session: Not on file  Stress:   . Feeling of Stress : Not on file  Social Connections:   . Frequency of Communication with Friends and Family: Not on file  . Frequency of Social Gatherings with Friends and Family: Not on file  . Attends Religious Services: Not on file  . Active Member of Clubs or Organizations: Not on file  . Attends  Archivist Meetings: Not on file  . Marital Status: Not on file  Intimate Partner Violence:   . Fear of Current or Ex-Partner: Not on file  . Emotionally Abused: Not on file  . Physically Abused: Not on file  . Sexually Abused: Not on file    Review of Systems: See HPI, otherwise negative ROS  Physical Exam: BP (!) 136/99   Pulse 81   Temp (!) 97.1 F (36.2 C) (Temporal)   Ht 5\' 7"  (1.702 m)   Wt 114.8 kg   SpO2 97%   BMI 39.63 kg/m  General:   Alert,  pleasant and cooperative in NAD Head:  Normocephalic and atraumatic. Lungs:  Clear to auscultation.    Heart:  Regular rate and rhythm.   Impression/Plan: Deborah Hayes is here for cataract surgery.  Risks, benefits, limitations, and alternatives regarding cataract surgery have been reviewed with the patient.  Questions have been answered.  All parties agreeable.   Birder Robson, MD  06/03/2020, 9:56 AM

## 2020-06-03 NOTE — Transfer of Care (Signed)
Immediate Anesthesia Transfer of Care Note  Patient: Deborah Hayes  Procedure(s) Performed: CATARACT EXTRACTION PHACO AND INTRAOCULAR LENS PLACEMENT (IOC) RIGHT 4.09 00:29.3 (Right Eye)  Patient Location: PACU  Anesthesia Type: MAC  Level of Consciousness: awake, alert  and patient cooperative  Airway and Oxygen Therapy: Patient Spontanous Breathing and Patient connected to supplemental oxygen  Post-op Assessment: Post-op Vital signs reviewed, Patient's Cardiovascular Status Stable, Respiratory Function Stable, Patent Airway and No signs of Nausea or vomiting  Post-op Vital Signs: Reviewed and stable  Complications: No complications documented.

## 2020-06-03 NOTE — Op Note (Signed)
PREOPERATIVE DIAGNOSIS:  Nuclear sclerotic cataract of the right eye.   POSTOPERATIVE DIAGNOSIS:  Nuclear sclerotic cataract of the right eye.   OPERATIVE PROCEDURE: Procedure(s): CATARACT EXTRACTION PHACO AND INTRAOCULAR LENS PLACEMENT (IOC) RIGHT 4.09 00:29.3   SURGEON:  Birder Robson, MD.   ANESTHESIA: 1.      Managed anesthesia care. 2.     0.53ml of Shugarcaine was instilled following the paracentesis  Anesthesiologist: Ronelle Nigh, MD CRNA: Silvana Newness, CRNA  COMPLICATIONS:  None.   TECHNIQUE:   Stop and chop    DESCRIPTION OF PROCEDURE:  The patient was examined and consented in the preoperative holding area where the aforementioned topical anesthesia was applied to the right eye.  The patient was brought back to the Operating Room where he was sat upright on the gurney and given a target to fixate upon while the eye was marked at the 3:00 and 9:00 position.  The patient was then reclined on the operating table.  The eye was prepped and draped in the usual sterile ophthalmic fashion and a lid speculum was placed. A paracentesis was created with the side port blade and the anterior chamber was filled with viscoelastic. A near clear corneal incision was performed with the steel keratome. A continuous curvilinear capsulorrhexis was performed with a cystotome followed by the capsulorrhexis forceps. Hydrodissection and hydrodelineation were carried out with BSS on a blunt cannula. The lens was removed in a stop and chop technique and the remaining cortical material was removed with the irrigation-aspiration handpiece. The eye was inflated with viscoelastic and the DIU  lens  was placed in the eye and rotated to within a few degrees of the predetermined orientation.  The remaining viscoelastic was removed from the eye.  The Sinskey hook was used to rotate the toric lens into its final resting place at 012 degrees.  0. The eye was inflated to a physiologic pressure and found to be  watertight. 0.44ml of Vigamox was placed in the anterior chamber.  The eye was dressed with Vigamox. The patient was given protective glasses to wear throughout the day and a shield with which to sleep tonight. The patient was also given drops with which to begin a drop regimen today and will follow-up with me in one day. Implant Name Type Inv. Item Serial No. Manufacturer Lot No. LRB No. Used Action  LENS IOL EYHANCE TORIC II 24.5 - [id number]  LENS IOL EYHANCE TORIC II 24.5 [id number] [employer]   Right 1 Implanted   Procedure(s): CATARACT EXTRACTION PHACO AND INTRAOCULAR LENS PLACEMENT (IOC) RIGHT 4.09 00:29.3 (Right)  Electronically signed: Birder Robson 06/03/2020 10:32 AM

## 2020-06-04 ENCOUNTER — Encounter: Payer: Self-pay | Admitting: Ophthalmology

## 2020-06-12 DIAGNOSIS — H25012 Cortical age-related cataract, left eye: Secondary | ICD-10-CM | POA: Diagnosis not present

## 2020-06-12 DIAGNOSIS — I10 Essential (primary) hypertension: Secondary | ICD-10-CM | POA: Diagnosis not present

## 2020-06-16 ENCOUNTER — Encounter: Payer: Self-pay | Admitting: Dermatology

## 2020-06-16 ENCOUNTER — Other Ambulatory Visit: Payer: Self-pay

## 2020-06-16 ENCOUNTER — Ambulatory Visit: Payer: Medicare PPO | Admitting: Dermatology

## 2020-06-16 DIAGNOSIS — L209 Atopic dermatitis, unspecified: Secondary | ICD-10-CM

## 2020-06-16 DIAGNOSIS — L814 Other melanin hyperpigmentation: Secondary | ICD-10-CM

## 2020-06-16 DIAGNOSIS — D229 Melanocytic nevi, unspecified: Secondary | ICD-10-CM

## 2020-06-16 DIAGNOSIS — L650 Telogen effluvium: Secondary | ICD-10-CM

## 2020-06-16 DIAGNOSIS — Z733 Stress, not elsewhere classified: Secondary | ICD-10-CM

## 2020-06-16 DIAGNOSIS — D1800 Hemangioma unspecified site: Secondary | ICD-10-CM

## 2020-06-16 DIAGNOSIS — Z1283 Encounter for screening for malignant neoplasm of skin: Secondary | ICD-10-CM

## 2020-06-16 DIAGNOSIS — L578 Other skin changes due to chronic exposure to nonionizing radiation: Secondary | ICD-10-CM

## 2020-06-16 DIAGNOSIS — L719 Rosacea, unspecified: Secondary | ICD-10-CM

## 2020-06-16 DIAGNOSIS — L821 Other seborrheic keratosis: Secondary | ICD-10-CM | POA: Diagnosis not present

## 2020-06-16 DIAGNOSIS — Z86018 Personal history of other benign neoplasm: Secondary | ICD-10-CM

## 2020-06-16 MED ORDER — TRIAMCINOLONE ACETONIDE 0.1 % EX CREA: TOPICAL_CREAM | CUTANEOUS | 3 refills | Status: DC

## 2020-06-16 NOTE — Progress Notes (Signed)
Follow-Up Visit   Subjective  Deborah Hayes is a 69 y.o. female who presents for the following: Annual Exam (Hx of dysplastic nevi ).  She also has developed a rash.  She also has concerns about hair loss.  She is under a lot of stress. The patient presents for Total-Body Skin Exam (TBSE) for skin cancer screening and mole check.  The following portions of the chart were reviewed this encounter and updated as appropriate:  Tobacco  Allergies  Meds  Problems  Med Hx  Surg Hx  Fam Hx     Review of Systems:  No other skin or systemic complaints except as noted in HPI or Assessment and Plan.  Objective  Well appearing patient in no apparent distress; mood and affect are within normal limits.  A full examination was performed including scalp, head, eyes, ears, nose, lips, neck, chest, axillae, abdomen, back, buttocks, bilateral upper extremities, bilateral lower extremities, hands, feet, fingers, toes, fingernails, and toenails. All findings within normal limits unless otherwise noted below.  Objective  Trunk, extremities: Pink patch of the upper back   Objective  Scalp: Diffuse thinning of hair, positive hair pull test.   Objective  Face: Erythema of the cheeks, chin, and nose.  Assessment & Plan  Atopic dermatitis Trunk, extremities  TMC 0.1% cream to aa's QD-BID PRN. Avoid f/g/a. Atopic dermatitis (eczema) is a chronic, relapsing, pruritic condition that can significantly affect quality of life. It is often associated with allergic rhinitis and/or asthma and can require treatment with topical medications, phototherapy, or in severe cases a biologic medication called Dupixent.    Topical steroids (such as triamcinolone, fluocinolone, fluocinonide, mometasone, clobetasol, halobetasol, betamethasone, hydrocortisone) can cause thinning and lightening of the skin if they are used for too long in the same area. Your physician has selected the right strength medicine for your  problem and area affected on the body. Please use your medication only as directed by your physician to prevent side effects.   triamcinolone (KENALOG) 0.1 % - Trunk, extremities  Telogen effluvium -chronic persistent hair loss.  Can be associated with stress; hormones; illness; surgery; emotional distress such as loss of ones and major life event changes in medications. Scalp Patient c/o a lot of stress related to her home life -  Will order TSH. Recommend Biotin 2.5mg  QD, Rogaine for Women daily, discussed light treatment and coupon given for Revian hair cap.  Also may use Rogaine 5% twice daily.  TSH - Scalp  Rosacea Face Rosacea is a chronic progressive skin condition usually affecting the face of adults. It is treatable but not curable. It sometimes affects the eyes (ocular rosacea) as well. It may respond to topical and/or systemic medication and can flare with stress, sun exposure, alcohol, exercise and some foods.  discussed BBL laser tx. patient declines at this time.   Lentigines - Scattered tan macules - Discussed due to sun exposure - Benign, observe - Call for any changes  Seborrheic Keratoses - Stuck-on, waxy, tan-brown papules and plaques  - Discussed benign etiology and prognosis. - Observe - Call for any changes  Melanocytic Nevi - Tan-brown and/or pink-flesh-colored symmetric macules and papules - Benign appearing on exam today - Observation - Call clinic for new or changing moles - Recommend daily use of broad spectrum spf 30+ sunscreen to sun-exposed areas.   Hemangiomas - Red papules - Discussed benign nature - Observe - Call for any changes  Actinic Damage - Chronic, secondary to cumulative UV/sun exposure -  diffuse scaly erythematous macules with underlying dyspigmentation - Recommend daily broad spectrum sunscreen SPF 30+ to sun-exposed areas, reapply every 2 hours as needed.  - Call for new or changing lesions.  History of Dysplastic Nevi -  No evidence of recurrence today - Recommend regular full body skin exams - Recommend daily broad spectrum sunscreen SPF 30+ to sun-exposed areas, reapply every 2 hours as needed.  - Call if any new or changing lesions are noted between office visits  Skin cancer screening performed today.  Return in about 1 year (around 06/16/2021) for TBSE - hx of dysplastic nevi .  Luther Redo, CMA, am acting as scribe for Sarina Ser, MD .  Documentation: I have reviewed the above documentation for accuracy and completeness, and I agree with the above.  Sarina Ser, MD

## 2020-06-18 ENCOUNTER — Other Ambulatory Visit: Payer: Self-pay

## 2020-06-18 ENCOUNTER — Encounter: Payer: Self-pay | Admitting: Dermatology

## 2020-06-18 ENCOUNTER — Encounter: Payer: Self-pay | Admitting: Ophthalmology

## 2020-06-20 ENCOUNTER — Other Ambulatory Visit
Admission: RE | Admit: 2020-06-20 | Discharge: 2020-06-20 | Disposition: A | Discharge status: Home / Self Care | Payer: Medicare PPO | Source: Ambulatory Visit | Attending: Ophthalmology | Admitting: Ophthalmology

## 2020-06-20 ENCOUNTER — Other Ambulatory Visit: Payer: Self-pay

## 2020-06-20 DIAGNOSIS — Z01812 Encounter for preprocedural laboratory examination: Secondary | ICD-10-CM | POA: Diagnosis not present

## 2020-06-20 DIAGNOSIS — Z20822 Contact with and (suspected) exposure to covid-19: Secondary | ICD-10-CM | POA: Insufficient documentation

## 2020-06-20 LAB — SARS CORONAVIRUS 2 (TAT 6-24 HRS): SARS Coronavirus 2: NEGATIVE

## 2020-06-20 NOTE — Discharge Instructions (Signed)

## 2020-06-24 ENCOUNTER — Encounter: Payer: Self-pay | Admitting: Ophthalmology

## 2020-06-24 ENCOUNTER — Ambulatory Visit: Payer: Medicare PPO | Admitting: Anesthesiology

## 2020-06-24 ENCOUNTER — Encounter
Admission: RE | Disposition: A | Discharge status: Home / Self Care | Payer: Self-pay | Source: Ambulatory Visit | Attending: Ophthalmology

## 2020-06-24 ENCOUNTER — Other Ambulatory Visit: Payer: Self-pay

## 2020-06-24 ENCOUNTER — Ambulatory Visit
Admission: RE | Admit: 2020-06-24 | Discharge: 2020-06-24 | Disposition: A | Discharge status: Home / Self Care | Payer: Medicare PPO | Source: Ambulatory Visit | Attending: Ophthalmology | Admitting: Ophthalmology

## 2020-06-24 DIAGNOSIS — H25012 Cortical age-related cataract, left eye: Secondary | ICD-10-CM | POA: Diagnosis not present

## 2020-06-24 DIAGNOSIS — Z79899 Other long term (current) drug therapy: Secondary | ICD-10-CM | POA: Diagnosis not present

## 2020-06-24 DIAGNOSIS — Z7982 Long term (current) use of aspirin: Secondary | ICD-10-CM | POA: Diagnosis not present

## 2020-06-24 DIAGNOSIS — Z96641 Presence of right artificial hip joint: Secondary | ICD-10-CM | POA: Insufficient documentation

## 2020-06-24 DIAGNOSIS — Z87891 Personal history of nicotine dependence: Secondary | ICD-10-CM | POA: Insufficient documentation

## 2020-06-24 DIAGNOSIS — H2512 Age-related nuclear cataract, left eye: Secondary | ICD-10-CM | POA: Insufficient documentation

## 2020-06-24 DIAGNOSIS — H25812 Combined forms of age-related cataract, left eye: Secondary | ICD-10-CM | POA: Diagnosis not present

## 2020-06-24 HISTORY — PX: CATARACT EXTRACTION W/PHACO: SHX586

## 2020-06-24 SURGERY — PHACOEMULSIFICATION, CATARACT, WITH IOL INSERTION
Anesthesia: Monitor Anesthesia Care | Site: Eye | Laterality: Left

## 2020-06-24 MED ORDER — LACTATED RINGERS IV SOLN: INTRAVENOUS | Status: DC

## 2020-06-24 MED ORDER — ARMC OPHTHALMIC DILATING DROPS
1.0000 "application " | OPHTHALMIC | Status: DC | PRN
  Administered 2020-06-24 (×3): 1 via OPHTHALMIC

## 2020-06-24 MED ORDER — NA CHONDROIT SULF-NA HYALURON 40-17 MG/ML IO SOLN
INTRAOCULAR | Status: DC | PRN
  Administered 2020-06-24: 1 mL via INTRAOCULAR

## 2020-06-24 MED ORDER — BRIMONIDINE TARTRATE-TIMOLOL 0.2-0.5 % OP SOLN
OPHTHALMIC | Status: DC | PRN
  Administered 2020-06-24: 1 [drp] via OPHTHALMIC

## 2020-06-24 MED ORDER — MIDAZOLAM HCL 2 MG/2ML IJ SOLN
INTRAMUSCULAR | Status: DC | PRN
  Administered 2020-06-24: 1 mg via INTRAVENOUS

## 2020-06-24 MED ORDER — ACETAMINOPHEN 10 MG/ML IV SOLN: 1000.0000 mg | Freq: Once | INTRAVENOUS | Status: DC | PRN

## 2020-06-24 MED ORDER — LIDOCAINE HCL (PF) 2 % IJ SOLN
INTRAOCULAR | Status: DC | PRN
  Administered 2020-06-24: 2 mL

## 2020-06-24 MED ORDER — TETRACAINE HCL 0.5 % OP SOLN
1.0000 [drp] | OPHTHALMIC | Status: DC | PRN
  Administered 2020-06-24 (×3): 1 [drp] via OPHTHALMIC

## 2020-06-24 MED ORDER — EPINEPHRINE PF 1 MG/ML IJ SOLN
INTRAOCULAR | Status: DC | PRN
  Administered 2020-06-24: 52 mL via OPHTHALMIC

## 2020-06-24 MED ORDER — ONDANSETRON HCL 4 MG/2ML IJ SOLN: 4.0000 mg | Freq: Once | INTRAMUSCULAR | Status: DC | PRN

## 2020-06-24 MED ORDER — MOXIFLOXACIN HCL 0.5 % OP SOLN
OPHTHALMIC | Status: DC | PRN
  Administered 2020-06-24: 0.2 mL via OPHTHALMIC

## 2020-06-24 MED ORDER — FENTANYL CITRATE (PF) 100 MCG/2ML IJ SOLN
INTRAMUSCULAR | Status: DC | PRN
  Administered 2020-06-24: 50 ug via INTRAVENOUS

## 2020-06-24 SURGICAL SUPPLY — 21 items
CANNULA ANT/CHMB 27G (MISCELLANEOUS) ×2 IMPLANT
CANNULA ANT/CHMB 27GA (MISCELLANEOUS) ×6 IMPLANT
GLOVE SURG LX 8.0 MICRO (GLOVE) ×2
GLOVE SURG LX STRL 8.0 MICRO (GLOVE) ×1 IMPLANT
GLOVE SURG TRIUMPH 8.0 PF LTX (GLOVE) ×3 IMPLANT
GOWN STRL REUS W/ TWL LRG LVL3 (GOWN DISPOSABLE) ×2 IMPLANT
GOWN STRL REUS W/TWL LRG LVL3 (GOWN DISPOSABLE) ×6
LENS IOL EYHANCE TORIC II 25.0 ×3 IMPLANT
LENS IOL EYHANCE TRC 450 25.0 IMPLANT
LENS IOL EYHNC TORIC 450 25.0 ×1 IMPLANT
MARKER SKIN DUAL TIP RULER LAB (MISCELLANEOUS) ×3 IMPLANT
NDL FILTER BLUNT 18X1 1/2 (NEEDLE) ×1 IMPLANT
NEEDLE FILTER BLUNT 18X 1/2SAF (NEEDLE) ×2
NEEDLE FILTER BLUNT 18X1 1/2 (NEEDLE) ×1 IMPLANT
PACK EYE AFTER SURG (MISCELLANEOUS) ×3 IMPLANT
PACK OPTHALMIC (MISCELLANEOUS) ×3 IMPLANT
PACK PORFILIO (MISCELLANEOUS) ×3 IMPLANT
SYR 3ML LL SCALE MARK (SYRINGE) ×3 IMPLANT
SYR TB 1ML LUER SLIP (SYRINGE) ×3 IMPLANT
WATER STERILE IRR 250ML POUR (IV SOLUTION) ×3 IMPLANT
WIPE NON LINTING 3.25X3.25 (MISCELLANEOUS) ×3 IMPLANT

## 2020-06-24 NOTE — Transfer of Care (Signed)
Immediate Anesthesia Transfer of Care Note  Patient: Deborah Hayes  Procedure(s) Performed: CATARACT EXTRACTION PHACO AND INTRAOCULAR LENS PLACEMENT (IOC) LEFT DIABETIC 2.62 00:26.1 (Left Eye)  Patient Location: PACU  Anesthesia Type: MAC  Level of Consciousness: awake, alert  and patient cooperative  Airway and Oxygen Therapy: Patient Spontanous Breathing and Patient connected to supplemental oxygen  Post-op Assessment: Post-op Vital signs reviewed, Patient's Cardiovascular Status Stable, Respiratory Function Stable, Patent Airway and No signs of Nausea or vomiting  Post-op Vital Signs: Reviewed and stable  Complications: No complications documented.

## 2020-06-24 NOTE — Anesthesia Preprocedure Evaluation (Signed)
Anesthesia Evaluation  Patient identified by MRN, date of birth, ID band Patient awake    Reviewed: Allergy & Precautions, H&P , NPO status , Patient's Chart, lab work & pertinent test results, reviewed documented beta blocker date and time   History of Anesthesia Complications Negative for: history of anesthetic complications  Airway Mallampati: III  TM Distance: >3 FB Neck ROM: full    Dental no notable dental hx.    Pulmonary former smoker,    Pulmonary exam normal breath sounds clear to auscultation       Cardiovascular hypertension, (-) angina(-) DOE Normal cardiovascular exam Rhythm:regular Rate:Normal   HLD   Neuro/Psych PSYCHIATRIC DISORDERS Anxiety    GI/Hepatic neg GERD  ,  Endo/Other  Morbid obesity  Renal/GU      Musculoskeletal  (+) Arthritis ,   Abdominal (+) + obese (BMI 40),   Peds  Hematology   Anesthesia Other Findings Gout  Reproductive/Obstetrics                            Anesthesia Physical  Anesthesia Plan  ASA: II  Anesthesia Plan: MAC   Post-op Pain Management:    Induction: Intravenous  PONV Risk Score and Plan: 2 and Treatment may vary due to age or medical condition, TIVA and Midazolam  Airway Management Planned: Nasal Cannula  Additional Equipment:   Intra-op Plan:   Post-operative Plan:   Informed Consent: I have reviewed the patients History and Physical, chart, labs and discussed the procedure including the risks, benefits and alternatives for the proposed anesthesia with the patient or authorized representative who has indicated his/her understanding and acceptance.     Dental Advisory Given  Plan Discussed with: CRNA  Anesthesia Plan Comments:         Anesthesia Quick Evaluation

## 2020-06-24 NOTE — Anesthesia Postprocedure Evaluation (Signed)
Anesthesia Post Note  Patient: Deborah Hayes  Procedure(s) Performed: CATARACT EXTRACTION PHACO AND INTRAOCULAR LENS PLACEMENT (IOC) LEFT DIABETIC 2.62 00:26.1 (Left Eye)     Patient location during evaluation: PACU Anesthesia Type: MAC Level of consciousness: awake and alert Pain management: pain level controlled Vital Signs Assessment: post-procedure vital signs reviewed and stable Respiratory status: spontaneous breathing, nonlabored ventilation, respiratory function stable and patient connected to nasal cannula oxygen Cardiovascular status: stable and blood pressure returned to baseline Postop Assessment: no apparent nausea or vomiting Anesthetic complications: no   No complications documented.   A  

## 2020-06-24 NOTE — Anesthesia Procedure Notes (Signed)
Procedure Name: MAC Date/Time: 06/24/2020 8:53 AM Performed by: Cameron Ali, CRNA Pre-anesthesia Checklist: Patient identified, Emergency Drugs available, Suction available, Timeout performed and Patient being monitored Patient Re-evaluated:Patient Re-evaluated prior to induction Oxygen Delivery Method: Nasal cannula Placement Confirmation: positive ETCO2

## 2020-06-24 NOTE — H&P (Signed)
[facility]   Primary Care Physician:  Cyndi Bender, PA-C Ophthalmologist: Dr. George Ina  Pre-Procedure History & Physical: HPI:  Deborah Hayes is a 69 y.o. female here for cataract surgery.   Past Medical History:  Diagnosis Date  . Abnormal glucose   . Anxiety   . BPPV (benign paroxysmal positional vertigo)    none for several years  . Degenerative disc disease, lumbar   . Dermatitis herpetiformis   . Gout   . Hx of dysplastic nevus 05/2015   L buttock, mild atypia; L inferior buttock, moderate atypia  . Hypertension   . Pure hypercholesterolemia   . Tobacco dependence    H/O    Past Surgical History:  Procedure Laterality Date  . ANTERIOR CRUCIATE LIGAMENT REPAIR Left   . CATARACT EXTRACTION Right 06/03/2020  . CATARACT EXTRACTION W/PHACO Right 06/03/2020   Procedure: CATARACT EXTRACTION PHACO AND INTRAOCULAR LENS PLACEMENT (IOC) RIGHT 4.09 00:29.3;  Surgeon: Birder Robson, MD;  Location: [place];  Service: Ophthalmology;  Laterality: Right;  . COLONOSCOPY WITH PROPOFOL N/A 03/28/2015   Procedure: COLONOSCOPY WITH PROPOFOL;  Surgeon: Lollie Sails, MD;  Location: [facility] ENDOSCOPY;  Service: Endoscopy;  Laterality: N/A;  . TOTAL HIP ARTHROPLASTY Right 2020    Prior to Admission medications   Medication Sig Start Date End Date Taking? Authorizing Provider  allopurinol (ZYLOPRIM) 100 MG tablet Take 300 mg by mouth daily.    Yes [provider]  Ascorbic Acid (VITAMIN C PO) Take by mouth.   Yes [provider]  ASPIRIN 81 PO Take by mouth daily.   Yes [provider]  atorvastatin (LIPITOR) 20 MG tablet Take 20 mg by mouth daily.   Yes [provider]  loratadine (CLARITIN REDITABS) 10 MG dissolvable tablet Take 10 mg by mouth daily.   Yes [provider]  losartan (COZAAR) 50 MG tablet Take 50 mg by mouth 2 (two) times daily.   Yes [provider]  metoprolol succinate (TOPROL-XL) 25 MG 24  hr tablet Take 25 mg by mouth daily.   Yes [provider]  Multiple Vitamins-Minerals (CENTRUM SILVER PO) Take by mouth daily.   Yes [provider]  Multiple Vitamins-Minerals (ZINC PO) Take by mouth.   Yes [provider]  Omega-3 Fatty Acids (OMEGA-3 FISH OIL PO) Take 600 mg by mouth daily.   Yes [provider]  triamcinolone (KENALOG) 0.1 % Apply to aa's eczema QD-BID PRN. Avoid f/g/a. 06/16/20  Yes Ralene Bathe, MD  vitamin B-12 (CYANOCOBALAMIN) 1000 MCG tablet Take 1,000 mcg by mouth daily.   Yes [provider]    Allergies as of 06/06/2020 - Review Complete 06/03/2020  Allergen Reaction Noted  . Sulfa antibiotics Hives 03/27/2015    History reviewed. No pertinent family history.  Social History   Socioeconomic History  . Marital status: Married    Spouse name: Not on file  . Number of children: Not on file  . Years of education: Not on file  . Highest education level: Not on file  Occupational History  . Not on file  Tobacco Use  . Smoking status: Former Smoker    Packs/day: 1.00    Years: 47.00    Pack years: 47.00    Types: Cigarettes    Quit date: 04/2014    Years since quitting: 6.2  . Smokeless tobacco: Never Used  Vaping Use  . Vaping Use: Never used  Substance and Sexual Activity  . Alcohol use: Yes  Alcohol/week: 5.0 standard drinks    Types: 5 Glasses of wine per week  . Drug use: Not on file  . Sexual activity: Not on file  Other Topics Concern  . Not on file  Social History Narrative  . Not on file   Social Determinants of Health   Financial Resource Strain:   . Difficulty of Paying Living Expenses: Not on file  Food Insecurity:   . Worried About Charity fundraiser in the Last Year: Not on file  . Ran Out of Food in the Last Year: Not on file  Transportation Needs:   . Lack of Transportation (Medical): Not on file  . Lack of Transportation (Non-Medical): Not on file  Physical Activity:    . Days of Exercise per Week: Not on file  . Minutes of Exercise per Session: Not on file  Stress:   . Feeling of Stress : Not on file  Social Connections:   . Frequency of Communication with Friends and Family: Not on file  . Frequency of Social Gatherings with Friends and Family: Not on file  . Attends Religious Services: Not on file  . Active Member of Clubs or Organizations: Not on file  . Attends Archivist Meetings: Not on file  . Marital Status: Not on file  Intimate Partner Violence:   . Fear of Current or Ex-Partner: Not on file  . Emotionally Abused: Not on file  . Physically Abused: Not on file  . Sexually Abused: Not on file    Review of Systems: See HPI, otherwise negative ROS  Physical Exam: BP (!) 155/99   Pulse 78   Temp (!) 97 F (36.1 C) (Temporal)   Resp 16   Ht 5\' 7"  (1.702 m)   Wt 114.8 kg   SpO2 95%   BMI 39.63 kg/m  General:   Alert,  pleasant and cooperative in NAD Head:  Normocephalic and atraumatic. Respiratory:  Normal work of breathing. Heart:  Regular rate and rhythm.   Impression/Plan: Deborah Hayes is here for cataract surgery.  Risks, benefits, limitations, and alternatives regarding cataract surgery have been reviewed with the patient.  Questions have been answered.  All parties agreeable.   Birder Robson, MD  06/24/2020, 8:46 AM

## 2020-06-24 NOTE — Op Note (Signed)
PREOPERATIVE DIAGNOSIS:  Nuclear sclerotic cataract of the left eye.   POSTOPERATIVE DIAGNOSIS:  Nuclear sclerotic cataract of the left eye.   OPERATIVE PROCEDURE: Procedure(s): CATARACT EXTRACTION PHACO AND INTRAOCULAR LENS PLACEMENT (IOC) LEFT DIABETIC 2.62 00:26.1   SURGEON:  Birder Robson, MD.   ANESTHESIA: 1.      Managed anesthesia care. 2.     0.52ml os Shugarcaine was instilled following the paracentesis [email or web address]   COMPLICATIONS:  None.   TECHNIQUE:   Stop and chop    DESCRIPTION OF PROCEDURE:  The patient was examined and consented in the preoperative holding area where the aforementioned topical anesthesia was applied to the left eye.  The patient was brought back to the Operating Room where he was sat upright on the gurney and given a target to fixate upon while the eye was marked at the 3:00 and 9:00 position.  The patient was then reclined on the operating table.  The eye was prepped and draped in the usual sterile ophthalmic fashion and a lid speculum was placed. A paracentesis was created with the side port blade and the anterior chamber was filled with viscoelastic. A near clear corneal incision was performed with the steel keratome. A continuous curvilinear capsulorrhexis was performed with a cystotome followed by the capsulorrhexis forceps. Hydrodissection and hydrodelineation were carried out with BSS on a blunt cannula. The lens was removed in a stop and chop technique and the remaining cortical material was removed with the irrigation-aspiration handpiece. The eye was inflated with viscoelastic and the DIU lens was placed in the eye and rotated to within a few degrees of the predetermined orientation.  The remaining viscoelastic was removed from the eye.  The Sinskey hook was used to rotate the toric lens into its final resting place at 173 degrees.  0.1 ml of Vigamox was placed in the anterior chamber. The eye was inflated to a physiologic pressure and found to be  watertight.  The eye was dressed with Vigamox. The patient was given protective glasses to wear throughout the day and a shield with which to sleep tonight. The patient was also given drops with which to begin a drop regimen today and will follow-up with me in one day. Implant Name Type Inv. Item Serial No. Manufacturer Lot No. LRB No. Used Action  LENS IOL EYHANCE TORIC II 25.0 - [id number]  LENS IOL EYHANCE TORIC II 25.0 [id number] [employer]   Left 1 Implanted   Procedure(s): CATARACT EXTRACTION PHACO AND INTRAOCULAR LENS PLACEMENT (IOC) LEFT DIABETIC 2.62 00:26.1 (Left)  Electronically signed: Birder Robson 11/23/20219:12 AM

## 2020-08-04 ENCOUNTER — Telehealth: Payer: Self-pay

## 2020-08-04 DIAGNOSIS — R109 Unspecified abdominal pain: Secondary | ICD-10-CM | POA: Diagnosis not present

## 2020-08-04 DIAGNOSIS — R7303 Prediabetes: Secondary | ICD-10-CM | POA: Diagnosis not present

## 2020-08-04 DIAGNOSIS — I2510 Atherosclerotic heart disease of native coronary artery without angina pectoris: Secondary | ICD-10-CM | POA: Diagnosis not present

## 2020-08-04 DIAGNOSIS — L659 Nonscarring hair loss, unspecified: Secondary | ICD-10-CM | POA: Diagnosis not present

## 2020-08-04 DIAGNOSIS — M109 Gout, unspecified: Secondary | ICD-10-CM | POA: Diagnosis not present

## 2020-08-04 DIAGNOSIS — I10 Essential (primary) hypertension: Secondary | ICD-10-CM | POA: Diagnosis not present

## 2020-08-04 DIAGNOSIS — Z6841 Body Mass Index (BMI) 40.0 and over, adult: Secondary | ICD-10-CM | POA: Diagnosis not present

## 2020-08-04 DIAGNOSIS — E7800 Pure hypercholesterolemia, unspecified: Secondary | ICD-10-CM | POA: Diagnosis not present

## 2020-08-04 NOTE — Telephone Encounter (Signed)
-----   Message from David C Kowalski, MD sent at 08/01/2020  1:36 PM EST ----- Please call pt and advise we have not seen results from recommended THYROID test (TSH) ordered in November 2021 to evaluate if thyroid abnormality associated with her hair loss. If she needs another order for TSH, please give her one. thanks  

## 2020-08-04 NOTE — Telephone Encounter (Signed)
Left message on voicemail to return my call.  

## 2020-08-04 NOTE — Telephone Encounter (Signed)
-----   Message from Deirdre Evener, MD sent at 08/01/2020  1:36 PM EST ----- Please call pt and advise we have not seen results from recommended THYROID test (TSH) ordered in November 2021 to evaluate if thyroid abnormality associated with her hair loss. If she needs another order for TSH, please give her one. thanks

## 2020-08-04 NOTE — Telephone Encounter (Signed)
Returned patient's call. She states that she went to her PCP this morning, but the power was out due to weather conditions. They are going to contact her and schedule an appointment to have her blood drawn and send those results to Dr. Gwen Pounds when they are available.

## 2020-08-20 DIAGNOSIS — E7800 Pure hypercholesterolemia, unspecified: Secondary | ICD-10-CM | POA: Diagnosis not present

## 2020-08-20 DIAGNOSIS — R7303 Prediabetes: Secondary | ICD-10-CM | POA: Diagnosis not present

## 2020-08-20 DIAGNOSIS — M109 Gout, unspecified: Secondary | ICD-10-CM | POA: Diagnosis not present

## 2020-08-20 DIAGNOSIS — L659 Nonscarring hair loss, unspecified: Secondary | ICD-10-CM | POA: Diagnosis not present

## 2020-08-20 DIAGNOSIS — I10 Essential (primary) hypertension: Secondary | ICD-10-CM | POA: Diagnosis not present

## 2020-08-21 NOTE — Telephone Encounter (Signed)
Patient's labs have been faxed in and placed in her chart. You will find these under chart review and media.

## 2020-08-22 ENCOUNTER — Other Ambulatory Visit: Payer: Self-pay | Admitting: Physician Assistant

## 2020-08-22 DIAGNOSIS — R109 Unspecified abdominal pain: Secondary | ICD-10-CM

## 2020-08-26 ENCOUNTER — Other Ambulatory Visit: Payer: Self-pay | Admitting: *Deleted

## 2020-08-26 DIAGNOSIS — Z87891 Personal history of nicotine dependence: Secondary | ICD-10-CM

## 2020-08-26 DIAGNOSIS — Z122 Encounter for screening for malignant neoplasm of respiratory organs: Secondary | ICD-10-CM

## 2020-08-26 NOTE — Progress Notes (Signed)
Contacted and will be scheduled for lung screening scan. Patient is  Former smoker, quit 04/2014, 47 pack year history.

## 2020-09-04 ENCOUNTER — Ambulatory Visit: Payer: Medicare PPO

## 2020-09-12 ENCOUNTER — Other Ambulatory Visit: Payer: Self-pay

## 2020-09-12 ENCOUNTER — Ambulatory Visit
Admission: RE | Admit: 2020-09-12 | Discharge: 2020-09-12 | Disposition: A | Discharge status: Home / Self Care | Payer: Medicare PPO | Source: Ambulatory Visit | Attending: Physician Assistant | Admitting: Physician Assistant

## 2020-09-12 DIAGNOSIS — K760 Fatty (change of) liver, not elsewhere classified: Secondary | ICD-10-CM | POA: Diagnosis not present

## 2020-09-12 DIAGNOSIS — R109 Unspecified abdominal pain: Secondary | ICD-10-CM | POA: Diagnosis not present

## 2020-09-12 DIAGNOSIS — Z87891 Personal history of nicotine dependence: Secondary | ICD-10-CM | POA: Diagnosis not present

## 2020-09-12 DIAGNOSIS — Z122 Encounter for screening for malignant neoplasm of respiratory organs: Secondary | ICD-10-CM

## 2020-09-12 LAB — POCT I-STAT CREATININE: Creatinine, Ser: 1.1 mg/dL — ABNORMAL HIGH (ref 0.44–1.00)

## 2020-09-12 MED ORDER — IOHEXOL 300 MG/ML  SOLN
100.0000 mL | Freq: Once | INTRAMUSCULAR | Status: AC | PRN
  Administered 2020-09-12: 100 mL via INTRAVENOUS

## 2020-09-16 ENCOUNTER — Encounter: Payer: Self-pay | Admitting: *Deleted

## 2021-02-05 DIAGNOSIS — Z961 Presence of intraocular lens: Secondary | ICD-10-CM | POA: Diagnosis not present

## 2021-04-14 DIAGNOSIS — U071 COVID-19: Secondary | ICD-10-CM | POA: Diagnosis not present

## 2021-04-14 DIAGNOSIS — Z6841 Body Mass Index (BMI) 40.0 and over, adult: Secondary | ICD-10-CM | POA: Diagnosis not present

## 2021-04-14 DIAGNOSIS — I2510 Atherosclerotic heart disease of native coronary artery without angina pectoris: Secondary | ICD-10-CM | POA: Diagnosis not present

## 2021-04-16 DIAGNOSIS — Z9181 History of falling: Secondary | ICD-10-CM | POA: Diagnosis not present

## 2021-04-16 DIAGNOSIS — Z0000 Encounter for general adult medical examination without abnormal findings: Secondary | ICD-10-CM | POA: Diagnosis not present

## 2021-04-16 DIAGNOSIS — E669 Obesity, unspecified: Secondary | ICD-10-CM | POA: Diagnosis not present

## 2021-04-16 DIAGNOSIS — E785 Hyperlipidemia, unspecified: Secondary | ICD-10-CM | POA: Diagnosis not present

## 2021-04-16 DIAGNOSIS — Z1331 Encounter for screening for depression: Secondary | ICD-10-CM | POA: Diagnosis not present

## 2021-05-20 DIAGNOSIS — M109 Gout, unspecified: Secondary | ICD-10-CM | POA: Diagnosis not present

## 2021-05-20 DIAGNOSIS — E785 Hyperlipidemia, unspecified: Secondary | ICD-10-CM | POA: Diagnosis not present

## 2021-05-20 DIAGNOSIS — I10 Essential (primary) hypertension: Secondary | ICD-10-CM | POA: Diagnosis not present

## 2021-05-20 DIAGNOSIS — E669 Obesity, unspecified: Secondary | ICD-10-CM | POA: Diagnosis not present

## 2021-05-20 DIAGNOSIS — Z87891 Personal history of nicotine dependence: Secondary | ICD-10-CM | POA: Diagnosis not present

## 2021-05-20 DIAGNOSIS — Z96649 Presence of unspecified artificial hip joint: Secondary | ICD-10-CM | POA: Diagnosis not present

## 2021-05-20 DIAGNOSIS — Z7982 Long term (current) use of aspirin: Secondary | ICD-10-CM | POA: Diagnosis not present

## 2021-05-26 DIAGNOSIS — I10 Essential (primary) hypertension: Secondary | ICD-10-CM | POA: Diagnosis not present

## 2021-05-26 DIAGNOSIS — R7303 Prediabetes: Secondary | ICD-10-CM | POA: Diagnosis not present

## 2021-05-26 DIAGNOSIS — L659 Nonscarring hair loss, unspecified: Secondary | ICD-10-CM | POA: Diagnosis not present

## 2021-05-26 DIAGNOSIS — E7800 Pure hypercholesterolemia, unspecified: Secondary | ICD-10-CM | POA: Diagnosis not present

## 2021-05-27 DIAGNOSIS — Z23 Encounter for immunization: Secondary | ICD-10-CM | POA: Diagnosis not present

## 2021-06-02 DIAGNOSIS — M109 Gout, unspecified: Secondary | ICD-10-CM | POA: Diagnosis not present

## 2021-06-02 DIAGNOSIS — I10 Essential (primary) hypertension: Secondary | ICD-10-CM | POA: Diagnosis not present

## 2021-06-02 DIAGNOSIS — R7303 Prediabetes: Secondary | ICD-10-CM | POA: Diagnosis not present

## 2021-06-02 DIAGNOSIS — E7800 Pure hypercholesterolemia, unspecified: Secondary | ICD-10-CM | POA: Diagnosis not present

## 2021-06-02 DIAGNOSIS — Z6839 Body mass index (BMI) 39.0-39.9, adult: Secondary | ICD-10-CM | POA: Diagnosis not present

## 2021-06-02 DIAGNOSIS — I2510 Atherosclerotic heart disease of native coronary artery without angina pectoris: Secondary | ICD-10-CM | POA: Diagnosis not present

## 2021-06-22 ENCOUNTER — Encounter: Payer: Medicare PPO | Admitting: Dermatology

## 2021-10-28 ENCOUNTER — Other Ambulatory Visit: Payer: Self-pay | Admitting: Physician Assistant

## 2021-10-28 DIAGNOSIS — Z1231 Encounter for screening mammogram for malignant neoplasm of breast: Secondary | ICD-10-CM

## 2021-10-29 ENCOUNTER — Other Ambulatory Visit: Payer: Self-pay | Admitting: *Deleted

## 2021-10-29 DIAGNOSIS — Z87891 Personal history of nicotine dependence: Secondary | ICD-10-CM

## 2021-11-02 DIAGNOSIS — H43821 Vitreomacular adhesion, right eye: Secondary | ICD-10-CM | POA: Diagnosis not present

## 2021-11-11 ENCOUNTER — Ambulatory Visit
Admission: RE | Admit: 2021-11-11 | Discharge: 2021-11-11 | Disposition: A | Discharge status: Home / Self Care | Payer: Medicare PPO | Source: Ambulatory Visit | Attending: Acute Care | Admitting: Acute Care

## 2021-11-11 DIAGNOSIS — Z87891 Personal history of nicotine dependence: Secondary | ICD-10-CM | POA: Diagnosis not present

## 2021-11-12 ENCOUNTER — Other Ambulatory Visit: Payer: Self-pay | Admitting: Dermatology

## 2021-11-12 DIAGNOSIS — L209 Atopic dermatitis, unspecified: Secondary | ICD-10-CM

## 2021-11-13 ENCOUNTER — Other Ambulatory Visit: Payer: Self-pay | Admitting: Acute Care

## 2021-11-13 DIAGNOSIS — Z87891 Personal history of nicotine dependence: Secondary | ICD-10-CM

## 2021-11-13 DIAGNOSIS — Z122 Encounter for screening for malignant neoplasm of respiratory organs: Secondary | ICD-10-CM

## 2021-11-30 DIAGNOSIS — E7800 Pure hypercholesterolemia, unspecified: Secondary | ICD-10-CM | POA: Diagnosis not present

## 2021-11-30 DIAGNOSIS — I10 Essential (primary) hypertension: Secondary | ICD-10-CM | POA: Diagnosis not present

## 2021-11-30 DIAGNOSIS — M109 Gout, unspecified: Secondary | ICD-10-CM | POA: Diagnosis not present

## 2021-11-30 DIAGNOSIS — R7303 Prediabetes: Secondary | ICD-10-CM | POA: Diagnosis not present

## 2021-12-02 DIAGNOSIS — M109 Gout, unspecified: Secondary | ICD-10-CM | POA: Diagnosis not present

## 2021-12-02 DIAGNOSIS — I2510 Atherosclerotic heart disease of native coronary artery without angina pectoris: Secondary | ICD-10-CM | POA: Diagnosis not present

## 2021-12-02 DIAGNOSIS — N183 Chronic kidney disease, stage 3 unspecified: Secondary | ICD-10-CM | POA: Diagnosis not present

## 2021-12-02 DIAGNOSIS — R7303 Prediabetes: Secondary | ICD-10-CM | POA: Diagnosis not present

## 2021-12-02 DIAGNOSIS — E7800 Pure hypercholesterolemia, unspecified: Secondary | ICD-10-CM | POA: Diagnosis not present

## 2021-12-02 DIAGNOSIS — Z6838 Body mass index (BMI) 38.0-38.9, adult: Secondary | ICD-10-CM | POA: Diagnosis not present

## 2021-12-02 DIAGNOSIS — I10 Essential (primary) hypertension: Secondary | ICD-10-CM | POA: Diagnosis not present

## 2021-12-02 DIAGNOSIS — Z139 Encounter for screening, unspecified: Secondary | ICD-10-CM | POA: Diagnosis not present

## 2021-12-03 ENCOUNTER — Ambulatory Visit
Admission: RE | Admit: 2021-12-03 | Discharge: 2021-12-03 | Disposition: A | Discharge status: Home / Self Care | Payer: Medicare PPO | Source: Ambulatory Visit | Attending: Physician Assistant | Admitting: Physician Assistant

## 2021-12-03 DIAGNOSIS — Z1231 Encounter for screening mammogram for malignant neoplasm of breast: Secondary | ICD-10-CM | POA: Insufficient documentation

## 2021-12-08 ENCOUNTER — Inpatient Hospital Stay
Admission: RE | Admit: 2021-12-08 | Discharge: 2021-12-08 | Disposition: A | Discharge status: Home / Self Care | Payer: Self-pay | Source: Ambulatory Visit | Attending: *Deleted | Admitting: *Deleted

## 2021-12-08 ENCOUNTER — Other Ambulatory Visit: Payer: Self-pay | Admitting: *Deleted

## 2021-12-08 DIAGNOSIS — Z1231 Encounter for screening mammogram for malignant neoplasm of breast: Secondary | ICD-10-CM

## 2021-12-24 ENCOUNTER — Ambulatory Visit: Payer: Medicare PPO | Admitting: Obstetrics

## 2021-12-24 ENCOUNTER — Encounter: Payer: Self-pay | Admitting: Obstetrics

## 2021-12-24 VITALS — BP 141/91 | HR 83 | Ht 67.0 in | Wt 242.8 lb

## 2021-12-24 DIAGNOSIS — Z01419 Encounter for gynecological examination (general) (routine) without abnormal findings: Secondary | ICD-10-CM | POA: Diagnosis not present

## 2021-12-24 DIAGNOSIS — Z1231 Encounter for screening mammogram for malignant neoplasm of breast: Secondary | ICD-10-CM

## 2021-12-24 NOTE — Progress Notes (Addendum)
SUBJECTIVE  HPI  Deborah Hayes is a 71 y.o.-year-old female who presents to establish care and for an annual gynecological exam today. She went through menopause at approximately age 6. She denies vaginal bleeding, abnormal odor, discharge, and pelvic pain. She is not currently sexually active. She believes her last Pap was around age 89 and was normal. She is going to request records from her previous provider. She does not have any health concerns today.  Medical/Surgical History Past Medical History:  Diagnosis Date   Abnormal glucose    Anxiety    BPPV (benign paroxysmal positional vertigo)    none for several years   Degenerative disc disease, lumbar    Dermatitis herpetiformis    Gout    Hx of dysplastic nevus 05/2015   L buttock, mild atypia; L inferior buttock, moderate atypia   Hypertension    Pure hypercholesterolemia    Tobacco dependence    H/O   Past Surgical History:  Procedure Laterality Date   ANTERIOR CRUCIATE LIGAMENT REPAIR Left    CATARACT EXTRACTION Right 06/03/2020   CATARACT EXTRACTION W/PHACO Right 06/03/2020   Procedure: CATARACT EXTRACTION PHACO AND INTRAOCULAR LENS PLACEMENT ([place]) RIGHT 4.09 00:29.3;  Surgeon: Birder Robson, MD;  Location: [place];  Service: Ophthalmology;  Laterality: Right;   CATARACT EXTRACTION W/PHACO Left 06/24/2020   Procedure: CATARACT EXTRACTION PHACO AND INTRAOCULAR LENS PLACEMENT (IOC) LEFT DIABETIC 2.62 00:26.1;  Surgeon: Birder Robson, MD;  Location: [place];  Service: Ophthalmology;  Laterality: Left;   COLONOSCOPY WITH PROPOFOL N/A 03/28/2015   Procedure: COLONOSCOPY WITH PROPOFOL;  Surgeon: Lollie Sails, MD;  Location: [facility] ENDOSCOPY;  Service: Endoscopy;  Laterality: N/A;   TOTAL HIP ARTHROPLASTY Right 2020    Social History Lives with husband; cares for grown son with autism on the weekends. 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 Exercise: stationary bike, yard work,  gardening, walking Substances: red wine/beer several times a week; former smoker - quit in 2015; denies vape and recreational drug use.  Obstetric History OB History   No obstetric history on file.      GYN/Menstrual History No LMP recorded. Patient is postmenopausal. Last Pap: 05/11/2017 - negative Contraception: N/A  Prevention Endorses regular dental and eye exams Mammogram: Yearly Sees GI specialist  Current Medications Outpatient Medications Prior to Visit  Medication Sig   allopurinol (ZYLOPRIM) 100 MG tablet Take 300 mg by mouth daily.    ASPIRIN 81 PO Take by mouth daily.   atorvastatin (LIPITOR) 20 MG tablet Take 20 mg by mouth daily.   loratadine (CLARITIN REDITABS) 10 MG dissolvable tablet Take 10 mg by mouth daily.   losartan (COZAAR) 50 MG tablet Take 50 mg by mouth 2 (two) times daily.   metoprolol succinate (TOPROL-XL) 25 MG 24 hr tablet Take 25 mg by mouth daily.   Multiple Vitamins-Minerals (CENTRUM SILVER PO) Take by mouth daily.   Multiple Vitamins-Minerals (ZINC PO) Take by mouth.   Omega-3 Fatty Acids (OMEGA-3 FISH OIL PO) Take 600 mg by mouth daily.   triamcinolone (KENALOG) 0.1 % Apply to aa's eczema QD-BID PRN. Avoid f/g/a.   vitamin B-12 (CYANOCOBALAMIN) 1000 MCG tablet Take 1,000 mcg by mouth daily.   [DISCONTINUED] Ascorbic Acid (VITAMIN C PO) Take by mouth. (Patient not taking: Reported on 12/24/2021)   No facility-administered medications prior to visit.     ROS History obtained from the patient General ROS: negative for - chills, hot flashes, or night sweats Psychological ROS: negative for - anxiety or  depression Ophthalmic ROS: positive for - vision changes in right eye (sees eye dr) negative for - blurry vision or itchy eyes Hematological and Lymphatic ROS: negative for - bleeding problems or bruising Endocrine ROS: negative for - polydipsia/polyuria or unexpected weight changes Breast ROS: negative for breast lumps positive for - edema in  right axilla. Mammogram negative Respiratory ROS: no cough, shortness of breath, or wheezing Cardiovascular ROS: no chest pain or dyspnea on exertion Gastrointestinal ROS: no abdominal pain, change in bowel habits, or black or bloody stools Genito-Urinary ROS: no dysuria, trouble voiding, or hematuria Musculoskeletal ROS: negative Dermatological ROS: negative      View : No data to display.           OBJECTIVE  Last Weight  Most recent update: 12/24/2021  9:07 AM    Weight  110.1 kg (242 lb 12.8 oz)             Body mass index is 38.03 kg/m.    BP (!) 141/91   Pulse 83   Ht '5\' 7"'$  (1.702 m)   Wt 242 lb 12.8 oz (110.1 kg)   BMI 38.03 kg/m  General appearance: alert, cooperative, and appears stated age Head: Normocephalic, without obvious abnormality, atraumatic Eyes: negative findings: lids and lashes normal and conjunctivae and sclerae normal Neck: no adenopathy, supple, symmetrical, trachea midline, and thyroid not enlarged, symmetric, no tenderness/mass/nodules Lungs: clear to auscultation bilaterally Breasts: normal appearance, no masses or tenderness, No nipple retraction or dimpling, No nipple discharge or bleeding, Normal to palpation without dominant masses, mild edema noted in right axilla Heart: regular rate and rhythm, S1, S2 normal, no murmur, click, rub or gallop Abdomen: soft, non-tender; bowel sounds normal; no masses,  no organomegaly Pelvic: cervix normal in appearance, external genitalia normal, no adnexal masses or tenderness, no cervical motion tenderness, rectovaginal septum normal, uterus normal size, shape, and consistency, and vagina normal without discharge Extremities: extremities normal, atraumatic, no cyanosis or edema Pulses: 2+ and symmetric Skin: Skin color, texture, turgor normal. No rashes or lesions Lymph nodes: Cervical, supraclavicular, and axillary nodes normal.  ASSESSMENT  1) Annual exam  PLAN 1) Physical exam as noted.  Discussed healthy lifestyle choices and stress reduction. Declines routine labs (done by PCP). 2) Pap: [facility] records show normal Pap 05/11/17. No need to continue Paps at this time.  Return in one year for annual exam or as needed for concerns.   Lloyd Huger, CNM

## 2022-02-25 DIAGNOSIS — N1831 Chronic kidney disease, stage 3a: Secondary | ICD-10-CM | POA: Diagnosis not present

## 2022-02-25 DIAGNOSIS — Z7982 Long term (current) use of aspirin: Secondary | ICD-10-CM | POA: Diagnosis not present

## 2022-02-25 DIAGNOSIS — I129 Hypertensive chronic kidney disease with stage 1 through stage 4 chronic kidney disease, or unspecified chronic kidney disease: Secondary | ICD-10-CM | POA: Diagnosis not present

## 2022-02-25 DIAGNOSIS — K08409 Partial loss of teeth, unspecified cause, unspecified class: Secondary | ICD-10-CM | POA: Diagnosis not present

## 2022-02-25 DIAGNOSIS — I2510 Atherosclerotic heart disease of native coronary artery without angina pectoris: Secondary | ICD-10-CM | POA: Diagnosis not present

## 2022-02-25 DIAGNOSIS — M109 Gout, unspecified: Secondary | ICD-10-CM | POA: Diagnosis not present

## 2022-02-25 DIAGNOSIS — E785 Hyperlipidemia, unspecified: Secondary | ICD-10-CM | POA: Diagnosis not present

## 2022-02-25 DIAGNOSIS — Z6836 Body mass index (BMI) 36.0-36.9, adult: Secondary | ICD-10-CM | POA: Diagnosis not present

## 2022-05-04 DIAGNOSIS — E669 Obesity, unspecified: Secondary | ICD-10-CM | POA: Diagnosis not present

## 2022-05-04 DIAGNOSIS — Z9181 History of falling: Secondary | ICD-10-CM | POA: Diagnosis not present

## 2022-05-04 DIAGNOSIS — Z6838 Body mass index (BMI) 38.0-38.9, adult: Secondary | ICD-10-CM | POA: Diagnosis not present

## 2022-05-04 DIAGNOSIS — E785 Hyperlipidemia, unspecified: Secondary | ICD-10-CM | POA: Diagnosis not present

## 2022-05-04 DIAGNOSIS — Z1331 Encounter for screening for depression: Secondary | ICD-10-CM | POA: Diagnosis not present

## 2022-05-04 DIAGNOSIS — Z139 Encounter for screening, unspecified: Secondary | ICD-10-CM | POA: Diagnosis not present

## 2022-05-04 DIAGNOSIS — Z0000 Encounter for general adult medical examination without abnormal findings: Secondary | ICD-10-CM | POA: Diagnosis not present

## 2022-06-07 DIAGNOSIS — I10 Essential (primary) hypertension: Secondary | ICD-10-CM | POA: Diagnosis not present

## 2022-06-07 DIAGNOSIS — M109 Gout, unspecified: Secondary | ICD-10-CM | POA: Diagnosis not present

## 2022-06-07 DIAGNOSIS — E7800 Pure hypercholesterolemia, unspecified: Secondary | ICD-10-CM | POA: Diagnosis not present

## 2022-06-07 DIAGNOSIS — R7303 Prediabetes: Secondary | ICD-10-CM | POA: Diagnosis not present

## 2022-06-09 DIAGNOSIS — M109 Gout, unspecified: Secondary | ICD-10-CM | POA: Diagnosis not present

## 2022-06-09 DIAGNOSIS — N183 Chronic kidney disease, stage 3 unspecified: Secondary | ICD-10-CM | POA: Diagnosis not present

## 2022-06-09 DIAGNOSIS — I2510 Atherosclerotic heart disease of native coronary artery without angina pectoris: Secondary | ICD-10-CM | POA: Diagnosis not present

## 2022-06-09 DIAGNOSIS — I700 Atherosclerosis of aorta: Secondary | ICD-10-CM | POA: Diagnosis not present

## 2022-06-09 DIAGNOSIS — I10 Essential (primary) hypertension: Secondary | ICD-10-CM | POA: Diagnosis not present

## 2022-06-09 DIAGNOSIS — E7800 Pure hypercholesterolemia, unspecified: Secondary | ICD-10-CM | POA: Diagnosis not present

## 2022-06-09 DIAGNOSIS — R7303 Prediabetes: Secondary | ICD-10-CM | POA: Diagnosis not present

## 2022-06-09 DIAGNOSIS — Z23 Encounter for immunization: Secondary | ICD-10-CM | POA: Diagnosis not present

## 2022-08-04 ENCOUNTER — Ambulatory Visit: Payer: Medicare PPO | Admitting: Dermatology

## 2022-08-04 DIAGNOSIS — L821 Other seborrheic keratosis: Secondary | ICD-10-CM | POA: Diagnosis not present

## 2022-08-04 DIAGNOSIS — L814 Other melanin hyperpigmentation: Secondary | ICD-10-CM | POA: Diagnosis not present

## 2022-08-04 DIAGNOSIS — L209 Atopic dermatitis, unspecified: Secondary | ICD-10-CM

## 2022-08-04 DIAGNOSIS — L299 Pruritus, unspecified: Secondary | ICD-10-CM | POA: Diagnosis not present

## 2022-08-04 DIAGNOSIS — Z1283 Encounter for screening for malignant neoplasm of skin: Secondary | ICD-10-CM | POA: Diagnosis not present

## 2022-08-04 DIAGNOSIS — L578 Other skin changes due to chronic exposure to nonionizing radiation: Secondary | ICD-10-CM

## 2022-08-04 DIAGNOSIS — D229 Melanocytic nevi, unspecified: Secondary | ICD-10-CM | POA: Diagnosis not present

## 2022-08-04 DIAGNOSIS — R202 Paresthesia of skin: Secondary | ICD-10-CM | POA: Diagnosis not present

## 2022-08-04 DIAGNOSIS — Z86018 Personal history of other benign neoplasm: Secondary | ICD-10-CM

## 2022-08-04 MED ORDER — TRIAMCINOLONE ACETONIDE 0.1 % EX CREA: TOPICAL_CREAM | CUTANEOUS | 5 refills | Status: AC

## 2022-08-04 NOTE — Patient Instructions (Signed)
Due to recent changes in healthcare laws, you may see results of your pathology and/or laboratory studies on MyChart before the doctors have had a chance to review them. We understand that in some cases there may be results that are confusing or concerning to you. Please understand that not all results are received at the same time and often the doctors may need to interpret multiple results in order to provide you with the best plan of care or course of treatment. Therefore, we ask that you please give us 2 business days to thoroughly review all your results before contacting the office for clarification. Should we see a critical lab result, you will be contacted sooner.   If You Need Anything After Your Visit  If you have any questions or concerns for your doctor, please call our main line at [phone] and press option 4 to reach your doctor's medical assistant. If no one answers, please leave a voicemail as directed and we will return your call as soon as possible. Messages left after 4 pm will be answered the following business day.   You may also send us a message via MyChart. We typically respond to MyChart messages within 1-2 business days.  For prescription refills, please ask your pharmacy to contact our office. Our fax number is [phone].  If you have an urgent issue when the clinic is closed that cannot wait until the next business day, you can page your doctor at the number below.    Please note that while we do our best to be available for urgent issues outside of office hours, we are not available 24/7.   If you have an urgent issue and are unable to reach us, you may choose to seek medical care at your doctor's office, retail clinic, urgent care center, or emergency room.  If you have a medical emergency, please immediately call 911 or go to the emergency department.  Pager Numbers  - Dr. Kowalski: [phone]  - Dr. Moye: [phone]  - Dr. Stewart:  [phone]  In the event of inclement weather, please call our main line at [phone] for an update on the status of any delays or closures.  Dermatology Medication Tips: Please keep the boxes that topical medications come in in order to help keep track of the instructions about where and how to use these. Pharmacies typically print the medication instructions only on the boxes and not directly on the medication tubes.   If your medication is too expensive, please contact our office at [phone] option 4 or send us a message through MyChart.   We are unable to tell what your co-pay for medications will be in advance as this is different depending on your insurance coverage. However, we may be able to find a substitute medication at lower cost or fill out paperwork to get insurance to cover a needed medication.   If a prior authorization is required to get your medication covered by your insurance company, please allow us 1-2 business days to complete this process.  Drug prices often vary depending on where the prescription is filled and some pharmacies may offer cheaper prices.  The website www.goodrx.com contains coupons for medications through different pharmacies. The prices here do not account for what the cost may be with help from insurance (it may be cheaper with your insurance), but the website can give you the price if you did not use any insurance.  - You can print the associated coupon and take it with   your prescription to the pharmacy.  - You may also stop by our office during regular business hours and pick up a GoodRx coupon card.  - If you need your prescription sent electronically to a different pharmacy, notify our office through [facility] MyChart or by phone at [phone] option 4.     Si Usted Necesita Algo Despus de Su Visita  Tambin puede enviarnos un mensaje a travs de MyChart. Por lo general respondemos a los mensajes de MyChart en el transcurso de 1 a 2  das hbiles.  Para renovar recetas, por favor pida a su farmacia que se ponga en contacto con nuestra oficina. Nuestro nmero de fax es el [phone].  Si tiene un asunto urgente cuando la clnica est cerrada y que no puede esperar hasta el siguiente da hbil, puede llamar/localizar a su doctor(a) al nmero que aparece a continuacin.   Por favor, tenga en cuenta que aunque hacemos todo lo posible para estar disponibles para asuntos urgentes fuera del horario de oficina, no estamos disponibles las 24 horas del da, los 7 das de la semana.   Si tiene un problema urgente y no puede comunicarse con nosotros, puede optar por buscar atencin mdica  en el consultorio de su doctor(a), en una clnica privada, en un centro de atencin urgente o en una sala de emergencias.  Si tiene una emergencia mdica, por favor llame inmediatamente al 911 o vaya a la sala de emergencias.  Nmeros de bper  - Dr. Kowalski: [phone]  - Dra. Moye: [phone]  - Dra. Stewart: [phone]  En caso de inclemencias del tiempo, por favor llame a nuestra lnea principal al [phone] para una actualizacin sobre el estado de cualquier retraso o cierre.  Consejos para la medicacin en dermatologa: Por favor, guarde las cajas en las que vienen los medicamentos de uso tpico para ayudarle a seguir las instrucciones sobre dnde y cmo usarlos. Las farmacias generalmente imprimen las instrucciones del medicamento slo en las cajas y no directamente en los tubos del medicamento.   Si su medicamento es muy caro, por favor, pngase en contacto con nuestra oficina llamando al [phone] y presione la opcin 4 o envenos un mensaje a travs de MyChart.   No podemos decirle cul ser su copago por los medicamentos por adelantado ya que esto es diferente dependiendo de la cobertura de su seguro. Sin embargo, es posible que podamos encontrar un medicamento sustituto a menor costo o llenar un formulario para que el  seguro cubra el medicamento que se considera necesario.   Si se requiere una autorizacin previa para que su compaa de seguros cubra su medicamento, por favor permtanos de 1 a 2 das hbiles para completar este proceso.  Los precios de los medicamentos varan con frecuencia dependiendo del lugar de dnde se surte la receta y alguna farmacias pueden ofrecer precios ms baratos.  El sitio web www.goodrx.com tiene cupones para medicamentos de diferentes farmacias. Los precios aqu no tienen en cuenta lo que podra costar con la ayuda del seguro (puede ser ms barato con su seguro), pero el sitio web puede darle el precio si no utiliz ningn seguro.  - Puede imprimir el cupn correspondiente y llevarlo con su receta a la farmacia.  - Tambin puede pasar por nuestra oficina durante el horario de atencin regular y recoger una tarjeta de cupones de GoodRx.  - Si necesita que su receta se enve electrnicamente a una farmacia diferente, informe a nuestra oficina a travs de MyChart de [place]   o por telfono llamando al [phone] y presione la opcin 4.  

## 2022-08-04 NOTE — Progress Notes (Unsigned)
Follow-Up Visit   Subjective  Deborah Hayes is a 72 y.o. female who presents for the following: Annual Exam (Hx dysplastic nevus). The patient presents for Total-Body Skin Exam (TBSE) for skin cancer screening and mole check.  The patient has spots, moles and lesions to be evaluated, some may be new or changing and the patient has concerns that these could be cancer.  The following portions of the chart were reviewed this encounter and updated as appropriate:   Tobacco  Allergies  Meds  Problems  Med Hx  Surg Hx  Fam Hx     Review of Systems:  No other skin or systemic complaints except as noted in HPI or Assessment and Plan.  Objective  Well appearing patient in no apparent distress; mood and affect are within normal limits.  A full examination was performed including scalp, head, eyes, ears, nose, lips, neck, chest, axillae, abdomen, back, buttocks, bilateral upper extremities, bilateral lower extremities, hands, feet, fingers, toes, fingernails, and toenails. All findings within normal limits unless otherwise noted below.  L cheek x 4 (4) Stuck-on, waxy, tan-brown papule or plaque --Discussed benign etiology and prognosis.    Assessment & Plan  Notalgia paresthetica Mid Back With atopic dermatitis and pruritus and Dyschromia -   Notalgia paresthetica is a chronic condition affecting the skin of the back in which a pinched nerve along the spine causes itching or changes in sensation in an area of skin. This is usually accompanied by chronic rubbing or scratching often leaving the area of skin discolored and thickened. There is no cure, but there are some treatments which may help control the itch.   Over the counter (non-prescription) treatments for notalgia paresthetica include numbing creams like pramoxine or lidocaine which temporarily reduce itch or Capsaicin-containing creams which cause a burning sensation but which sometimes over time will reset the nerves to stop  producing itch.  If you choose to use Capsaicin cream, it is recommended to use it 5 times daily for 1 week followed by 3 times daily for 3-6 weeks. You may have to continue using it long-term. For severe cases, there are some prescription cream or pill options which may help. Other treatment options include: - Transcutaneous Electrical Nerve Stimulation (TENS) - Gabapentin 300-900 mg daily po - Amitriptyline orally - Paravertebral local anesthetic block - intralesional Botulinum toxin A  Atopic dermatitis (eczema) is a chronic, relapsing, pruritic condition that can significantly affect quality of life. It is often associated with allergic rhinitis and/or asthma and can require treatment with topical medications, phototherapy, or in severe cases biologic injectable medication (Dupixent; Adbry) or Oral JAK inhibitors.  Continue TMC 0.1% cream QD-BID up to 5d/wk PRN. Topical steroids (such as triamcinolone, fluocinolone, fluocinonide, mometasone, clobetasol, halobetasol, betamethasone, hydrocortisone) can cause thinning and lightening of the skin if they are used for too long in the same area. Your physician has selected the right strength medicine for your problem and area affected on the body. Please use your medication only as directed by your physician to prevent side effects.   Related Medications triamcinolone cream (KENALOG) 0.1 % Apply to aa's eczema QD-BID PRN up to 5d/wk. Avoid f/g/a.  Seborrheic keratosis (4) L cheek x 4 Reassured benign age-related growth.  Recommend observation.  Discussed cryotherapy if spot(s) become irritated or inflamed. Patient would like lesions treated for cosmetic reasons. Advised patient of non-covered fee of $60 for first lesion and $15 for each thereafter.  Cosmetic fee today $105.  Lentigines - Scattered  tan macules - Due to sun exposure - Benign-appearing, observe - Recommend daily broad spectrum sunscreen SPF 30+ to sun-exposed areas, reapply every  2 hours as needed. - Call for any changes  Seborrheic Keratoses - Stuck-on, waxy, tan-brown papules and/or plaques  - Benign-appearing - Discussed benign etiology and prognosis. - Observe - Call for any changes  Melanocytic Nevi - Tan-brown and/or pink-flesh-colored symmetric macules and papules - Benign appearing on exam today - Observation - Call clinic for new or changing moles - Recommend daily use of broad spectrum spf 30+ sunscreen to sun-exposed areas.   Hemangiomas - Red papules - Discussed benign nature - Observe - Call for any changes  Actinic Damage - Chronic condition, secondary to cumulative UV/sun exposure - diffuse scaly erythematous macules with underlying dyspigmentation - Recommend daily broad spectrum sunscreen SPF 30+ to sun-exposed areas, reapply every 2 hours as needed.  - Staying in the shade or wearing long sleeves, sun glasses (UVA+UVB protection) and wide brim hats (4-inch brim around the entire circumference of the hat) are also recommended for sun protection.  - Call for new or changing lesions.  History of Dysplastic Nevus - No evidence of recurrence today - Recommend regular full body skin exams - Recommend daily broad spectrum sunscreen SPF 30+ to sun-exposed areas, reapply every 2 hours as needed.  - Call if any new or changing lesions are noted between office visits  Skin cancer screening performed today.  Return in about 1 year (around 08/05/2023) for TBSE.  Luther Redo, CMA, am acting as scribe for Sarina Ser, MD . Documentation: I have reviewed the above documentation for accuracy and completeness, and I agree with the above.  Sarina Ser, MD

## 2022-08-05 ENCOUNTER — Encounter: Payer: Self-pay | Admitting: Dermatology

## 2022-09-09 DIAGNOSIS — Z96649 Presence of unspecified artificial hip joint: Secondary | ICD-10-CM | POA: Diagnosis not present

## 2022-09-09 DIAGNOSIS — N182 Chronic kidney disease, stage 2 (mild): Secondary | ICD-10-CM | POA: Diagnosis not present

## 2022-09-09 DIAGNOSIS — Z8249 Family history of ischemic heart disease and other diseases of the circulatory system: Secondary | ICD-10-CM | POA: Diagnosis not present

## 2022-09-09 DIAGNOSIS — E785 Hyperlipidemia, unspecified: Secondary | ICD-10-CM | POA: Diagnosis not present

## 2022-09-09 DIAGNOSIS — Z87891 Personal history of nicotine dependence: Secondary | ICD-10-CM | POA: Diagnosis not present

## 2022-09-09 DIAGNOSIS — Z6832 Body mass index (BMI) 32.0-32.9, adult: Secondary | ICD-10-CM | POA: Diagnosis not present

## 2022-09-09 DIAGNOSIS — E669 Obesity, unspecified: Secondary | ICD-10-CM | POA: Diagnosis not present

## 2022-09-09 DIAGNOSIS — M1030 Gout due to renal impairment, unspecified site: Secondary | ICD-10-CM | POA: Diagnosis not present

## 2022-09-09 DIAGNOSIS — I129 Hypertensive chronic kidney disease with stage 1 through stage 4 chronic kidney disease, or unspecified chronic kidney disease: Secondary | ICD-10-CM | POA: Diagnosis not present

## 2022-10-17 ENCOUNTER — Other Ambulatory Visit: Payer: Self-pay | Admitting: Acute Care

## 2022-10-17 DIAGNOSIS — Z122 Encounter for screening for malignant neoplasm of respiratory organs: Secondary | ICD-10-CM

## 2022-10-17 DIAGNOSIS — Z87891 Personal history of nicotine dependence: Secondary | ICD-10-CM

## 2022-11-04 DIAGNOSIS — M3501 Sicca syndrome with keratoconjunctivitis: Secondary | ICD-10-CM | POA: Diagnosis not present

## 2022-11-04 DIAGNOSIS — H179 Unspecified corneal scar and opacity: Secondary | ICD-10-CM | POA: Diagnosis not present

## 2022-11-04 DIAGNOSIS — Z961 Presence of intraocular lens: Secondary | ICD-10-CM | POA: Diagnosis not present

## 2022-11-15 ENCOUNTER — Ambulatory Visit
Admission: RE | Admit: 2022-11-15 | Discharge: 2022-11-15 | Disposition: A | Discharge status: Home / Self Care | Payer: Medicare PPO | Source: Ambulatory Visit | Attending: Physician Assistant | Admitting: Physician Assistant

## 2022-11-15 DIAGNOSIS — Z87891 Personal history of nicotine dependence: Secondary | ICD-10-CM | POA: Diagnosis not present

## 2022-11-15 DIAGNOSIS — Z122 Encounter for screening for malignant neoplasm of respiratory organs: Secondary | ICD-10-CM | POA: Diagnosis not present

## 2022-11-17 ENCOUNTER — Other Ambulatory Visit: Payer: Self-pay | Admitting: Acute Care

## 2022-11-17 DIAGNOSIS — Z87891 Personal history of nicotine dependence: Secondary | ICD-10-CM

## 2022-11-17 DIAGNOSIS — Z122 Encounter for screening for malignant neoplasm of respiratory organs: Secondary | ICD-10-CM

## 2022-12-06 ENCOUNTER — Ambulatory Visit
Admission: RE | Admit: 2022-12-06 | Discharge: 2022-12-06 | Disposition: A | Discharge status: Home / Self Care | Payer: Medicare PPO | Source: Ambulatory Visit | Attending: Obstetrics | Admitting: Obstetrics

## 2022-12-06 DIAGNOSIS — Z1231 Encounter for screening mammogram for malignant neoplasm of breast: Secondary | ICD-10-CM | POA: Insufficient documentation

## 2022-12-08 DIAGNOSIS — R7303 Prediabetes: Secondary | ICD-10-CM | POA: Diagnosis not present

## 2022-12-08 DIAGNOSIS — R002 Palpitations: Secondary | ICD-10-CM | POA: Diagnosis not present

## 2022-12-08 DIAGNOSIS — I10 Essential (primary) hypertension: Secondary | ICD-10-CM | POA: Diagnosis not present

## 2022-12-08 DIAGNOSIS — E7800 Pure hypercholesterolemia, unspecified: Secondary | ICD-10-CM | POA: Diagnosis not present

## 2022-12-08 DIAGNOSIS — M109 Gout, unspecified: Secondary | ICD-10-CM | POA: Diagnosis not present

## 2022-12-10 DIAGNOSIS — E7800 Pure hypercholesterolemia, unspecified: Secondary | ICD-10-CM | POA: Diagnosis not present

## 2022-12-10 DIAGNOSIS — I700 Atherosclerosis of aorta: Secondary | ICD-10-CM | POA: Diagnosis not present

## 2022-12-10 DIAGNOSIS — N183 Chronic kidney disease, stage 3 unspecified: Secondary | ICD-10-CM | POA: Diagnosis not present

## 2022-12-10 DIAGNOSIS — M109 Gout, unspecified: Secondary | ICD-10-CM | POA: Diagnosis not present

## 2022-12-10 DIAGNOSIS — R7303 Prediabetes: Secondary | ICD-10-CM | POA: Diagnosis not present

## 2022-12-10 DIAGNOSIS — I10 Essential (primary) hypertension: Secondary | ICD-10-CM | POA: Diagnosis not present

## 2022-12-10 DIAGNOSIS — I2510 Atherosclerotic heart disease of native coronary artery without angina pectoris: Secondary | ICD-10-CM | POA: Diagnosis not present

## 2022-12-10 DIAGNOSIS — R002 Palpitations: Secondary | ICD-10-CM | POA: Diagnosis not present

## 2022-12-29 ENCOUNTER — Encounter: Payer: Self-pay | Admitting: Obstetrics

## 2022-12-29 ENCOUNTER — Ambulatory Visit (INDEPENDENT_AMBULATORY_CARE_PROVIDER_SITE_OTHER): Payer: Medicare PPO | Admitting: Obstetrics

## 2022-12-29 VITALS — BP 125/82 | HR 65 | Ht 66.0 in | Wt 200.4 lb

## 2022-12-29 DIAGNOSIS — Z1231 Encounter for screening mammogram for malignant neoplasm of breast: Secondary | ICD-10-CM

## 2022-12-29 DIAGNOSIS — Z01419 Encounter for gynecological examination (general) (routine) without abnormal findings: Secondary | ICD-10-CM | POA: Diagnosis not present

## 2022-12-29 DIAGNOSIS — K5792 Diverticulitis of intestine, part unspecified, without perforation or abscess without bleeding: Secondary | ICD-10-CM

## 2022-12-29 NOTE — Progress Notes (Signed)
ANNUAL GYNECOLOGICAL EXAM  SUBJECTIVE  HPI  Deborah Hayes is a 72 y.o.-year-old No obstetric history on file. who presents for an annual gynecological exam today.  She denies pelvic pain, dyspareunia, abnormal vaginal bleeding, dryness, or discharge, and UTI symptoms. She went through menopause around age 24. She recently lost about 50 lbs with diet and exercise. She has also recently started having hot flashes and thinning hair.  Medical/Surgical History Past Medical History:  Diagnosis Date   Abnormal glucose    Anxiety    BPPV (benign paroxysmal positional vertigo)    none for several years   Degenerative disc disease, lumbar    Dermatitis herpetiformis    Gout    Hx of dysplastic nevus 05/2015   L buttock, mild atypia; L inferior buttock, moderate atypia   Hypertension    Pure hypercholesterolemia    Tobacco dependence    H/O   Past Surgical History:  Procedure Laterality Date   ANTERIOR CRUCIATE LIGAMENT REPAIR Left    CATARACT EXTRACTION Right 06/03/2020   CATARACT EXTRACTION W/PHACO Right 06/03/2020   Procedure: CATARACT EXTRACTION PHACO AND INTRAOCULAR LENS PLACEMENT (IOC) RIGHT 4.09 00:29.3;  Surgeon: Galen Manila, MD;  Location: [facility];  Service: Ophthalmology;  Laterality: Right;   CATARACT EXTRACTION W/PHACO Left 06/24/2020   Procedure: CATARACT EXTRACTION PHACO AND INTRAOCULAR LENS PLACEMENT (IOC) LEFT DIABETIC 2.62 00:26.1;  Surgeon: Galen Manila, MD;  Location: [facility];  Service: Ophthalmology;  Laterality: Left;   COLONOSCOPY WITH PROPOFOL N/A 03/28/2015   Procedure: COLONOSCOPY WITH PROPOFOL;  Surgeon: Christena Deem, MD;  Location[facility];  Service: Endoscopy;  Laterality: N/A;   TOTAL HIP ARTHROPLASTY Right 2020    Social History Lives with husband who is in early stages of dementia and son with autism on the weekends 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      GYN/Menstrual History No LMP recorded. Patient is postmenopausal. Last Pap: 05/11/2017. NILM/negative HPV. Contraception: Postmenopausal  Prevention Endorses regular dental and eye exams Mammogram: yearly Colonoscopy: due  Current Medications Outpatient Medications Prior to Visit  Medication Sig   allopurinol (ZYLOPRIM) 100 MG tablet Take 300 mg by mouth daily.    ASPIRIN 81 PO Take by mouth daily.   atorvastatin (LIPITOR) 20 MG tablet Take 20 mg by mouth daily.   loratadine (CLARITIN REDITABS) 10 MG dissolvable tablet Take 10 mg by mouth daily.   losartan (COZAAR) 50 MG tablet Take 50 mg by mouth 2 (two) times daily.   metoprolol succinate (TOPROL-XL) 25 MG 24 hr tablet Take 25 mg by mouth daily.   Multiple Vitamins-Minerals (CENTRUM SILVER PO) Take by mouth daily.   Multiple Vitamins-Minerals (ZINC PO) Take by mouth.   Omega-3 Fatty Acids (OMEGA-3 FISH OIL PO) Take 600 mg by mouth daily.   triamcinolone cream (KENALOG) 0.1 % Apply to aa's eczema QD-BID PRN up to 5d/wk. Avoid f/g/a.   vitamin B-12 (CYANOCOBALAMIN) 1000 MCG tablet Take 1,000 mcg by mouth daily.   No facility-administered medications prior to visit.       ROS Constitutional: Denied constitutional symptoms, night sweats, recent illness, fatigue, fever, insomnia and weight loss.  Eyes: Denied eye symptoms, eye pain, photophobia, vision change and visual disturbance.  Ears/Nose/Throat/Neck: Denied ear, nose, throat or neck symptoms, hearing loss, nasal discharge, sinus congestion and sore throat.  Cardiovascular: Denied cardiovascular symptoms,  arrhythmia, chest pain/pressure, edema, exercise intolerance, orthopnea and palpitations. +episode of a. fib, seeing cardiologist in August  Respiratory: Denied pulmonary symptoms, asthma, pleuritic pain, productive sputum, cough, dyspnea and wheezing.   Gastrointestinal: Denied, gastro-esophageal reflux, melena, nausea and vomiting.  Genitourinary: Denied genitourinary symptoms including symptomatic vaginal discharge, pelvic relaxation issues, and urinary complaints.  Musculoskeletal: Denied musculoskeletal symptoms, stiffness, swelling, muscle weakness and myalgia.  Dermatologic: Denied dermatology symptoms, rash and scar.  Neurologic: Denied neurology symptoms, dizziness, headache, neck pain and syncope.  Psychiatric: Denied psychiatric symptoms, anxiety and depression.  Endocrine: +hot flashes, thinning hair    OBJECTIVE  Ht 5\' 6"  (1.676 m)   Wt 200 lb 6.4 oz (90.9 kg)   BMI 32.35 kg/m    Physical examination General NAD, Conversant  HEENT Atraumatic; Op clear with mmm.  Normo-cephalic. Pupils reactive. Anicteric sclerae  Thyroid/Neck Smooth without nodularity or enlargement. Normal ROM.  Neck Supple.  Skin No rashes, lesions or ulceration. Normal palpated skin turgor. No nodularity.  Breasts: No masses or discharge.  Symmetric.  No axillary adenopathy.  Lungs: Clear to auscultation.No rales or wheezes. Normal Respiratory effort, no retractions.  Heart: NSR.  No murmurs or rubs appreciated. No peripheral edema  Abdomen: Soft.  Non-tender.  No masses.  No HSM. No hernia  Extremities: Moves all appropriately.  Normal ROM for age. No lymphadenopathy.  Neuro: Oriented to PPT.  Normal mood. Normal affect.   Pelvic: Declined  ASSESSMENT  1) Annual exam 2) Hot flashes  PLAN 1) Physical exam as noted. Encouraged to maintain healthy lifestyle choices. Reviewed preventive care. Mammogram ordered for 2025. Referral for colonoscopy placed.  2) Discussed herbal remedies for hot flashes. Handout given.  Return in one year for annual exam or as needed for concerns.   Guadlupe Spanish, CNM

## 2023-01-25 ENCOUNTER — Other Ambulatory Visit: Payer: Self-pay

## 2023-01-25 DIAGNOSIS — R300 Dysuria: Secondary | ICD-10-CM | POA: Insufficient documentation

## 2023-01-26 NOTE — Progress Notes (Unsigned)
Celso Amy, PA-C [address]  [address]  [place], [place] [address]  Main: [phone]  Fax: [phone]   Gastroenterology Consultation  Referring Provider:     Lonie Peak, PA-C Primary Care Physician:  Lonie Peak, PA-C Primary Gastroenterologist:  Celso Amy, PA-C / Dr. Wyline Mood  Reason for Consultation:     Diverticulitis        HPI:   Deborah Hayes is a 72 y.o. y/o female referred for consultation & management  by Lonie Peak, PA-C.    GI symptoms: NONE.  She denies abdominal pain, constipation, diarrhea, rectal bleeding, heartburn, or dysphagia.  She has had a 50 pound intentional weight loss in the past year with healthy diet and exercise.  Previously saw Dr. Marva Panda at [facility] in 2016 for a screening colonoscopy which showed pan diverticulosis and no polyps.  Torturous colon.  Fair Prep.  Repeat in 5 years.  Normal colonoscopy in 2005, no polyps.  No Family history of colon cancer.  Last abdominal pelvic CT with contrast 09/2020 showed severe pancolonic diverticulosis, most severe in sigmoid colon.  No evidence of diverticulitis.  No bowel inflammation.  Tiny fat-containing umbilical hernia.  Moderate hepatic steatosis.  Normal gallbladder.  Past Medical History:  Diagnosis Date   Abnormal glucose    Anxiety    BPPV (benign paroxysmal positional vertigo)    none for several years   Degenerative disc disease, lumbar    Dermatitis herpetiformis    Gout    Hx of dysplastic nevus 05/2015   L buttock, mild atypia; L inferior buttock, moderate atypia   Hypertension    Pure hypercholesterolemia    Tobacco dependence    H/O    Past Surgical History:  Procedure Laterality Date   ANTERIOR CRUCIATE LIGAMENT REPAIR Left    CATARACT EXTRACTION Right 06/03/2020   CATARACT EXTRACTION W/PHACO Right 06/03/2020   Procedure: CATARACT EXTRACTION PHACO AND INTRAOCULAR LENS PLACEMENT (IOC) RIGHT 4.09 00:29.3;  Surgeon: Galen Manila,  MD;  Location: [facility];  Service: Ophthalmology;  Laterality: Right;   CATARACT EXTRACTION W/PHACO Left 06/24/2020   Procedure: CATARACT EXTRACTION PHACO AND INTRAOCULAR LENS PLACEMENT (IOC) LEFT DIABETIC 2.62 00:26.1;  Surgeon: Galen Manila, MD;  Location: [facility];  Service: Ophthalmology;  Laterality: Left;   COLONOSCOPY WITH PROPOFOL N/A 03/28/2015   Procedure: COLONOSCOPY WITH PROPOFOL;  Surgeon: Christena Deem, MD;  Location: [facility] END[facility];  Service: Endoscopy;  Laterality: N/A;   TOTAL HIP ARTHROPLASTY Right 2020    Prior to Admission medications   Medication Sig Start Date End Date Taking? Authorizing Provider  allopurinol (ZYLOPRIM) 100 MG tablet Take 300 mg by mouth daily.     [provider]  ASPIRIN 81 PO Take by mouth daily.    [provider]  atorvastatin (LIPITOR) 20 MG tablet Take 20 mg by mouth daily.    [provider]  loratadine (CLARITIN REDITABS) 10 MG dissolvable tablet Take 10 mg by mouth daily.    [provider]  losartan (COZAAR) 50 MG tablet Take 50 mg by mouth 2 (two) times daily.    [provider]  metoprolol succinate (TOPROL-XL) 25 MG 24 hr tablet Take 25 mg by mouth daily.    [provider]  Multiple Vitamins-Minerals (CENTRUM SILVER PO) Take by mouth daily.    [provider]  Multiple Vitamins-Minerals (ZINC PO) Take by mouth.    [provider]  Omega-3 Fatty Acids (OMEGA-3 FISH OIL PO) Take  600 mg by mouth daily.    [provider]  triamcinolone cream (KENALOG) 0.1 % Apply to aa's eczema QD-BID PRN up to 5d/wk. Avoid f/g/a. 08/04/22   Deirdre Evener, MD    Family History  Problem Relation Age of Onset   Dementia Mother      Social History   Tobacco Use   Smoking status: Former    Packs/day: 1.00    Years: 47.00    Additional pack years: 0.00    Total pack years: 47.00    Types: Cigarettes    Quit date: 04/2014    Years since  quitting: 8.8   Smokeless tobacco: Never  Vaping Use   Vaping Use: Never used  Substance Use Topics   Alcohol use: Yes    Alcohol/week: 5.0 standard drinks of alcohol    Types: 5 Glasses of wine per week    Allergies as of 01/27/2023 - Review Complete 12/29/2022  Allergen Reaction Noted   Sulfa antibiotics Hives 03/27/2015    Review of Systems:    All systems reviewed and negative except where noted in HPI.   Physical Exam:  There were no vitals taken for this visit. No LMP recorded. Patient is postmenopausal. Psych:  Alert and cooperative. Normal mood and affect. General:   Alert,  Well-developed, well-nourished, pleasant and cooperative in NAD Head:  Normocephalic and atraumatic. Eyes:  Sclera clear, no icterus.   Conjunctiva pink. Neck:  Supple; no masses or thyromegaly. Lungs:  Respirations even and unlabored.  Clear throughout to auscultation.   No wheezes, crackles, or rhonchi. No acute distress. Heart:  Regular rate and rhythm; no murmurs, clicks, rubs, or gallops. Abdomen:  Normal bowel sounds.  No bruits.  Soft, and non-distended without masses, hepatosplenomegaly or hernias noted.  No Tenderness.  No guarding or rebound tenderness.    Neurologic:  Alert and oriented x3;  grossly normal neurologically. Psych:  Alert and cooperative. Normal mood and affect.  Imaging Studies: No results found.  Assessment and Plan:   Deborah Hayes is a 72 y.o. y/o female has been referred to discuss scheduling a repeat screening colonoscopy.  Last colonoscopy in 2016 showed no polyps, however prep was fair.  5-year repeat colonoscopy was recommended.  She has no GI symptoms.  No family history of colon cancer.  1.  Severe pandiverticulosis - Asymptomatic  Patient education given.  Follow-up if she develops any GI symptoms.  2.  Colon cancer screening  Scheduling Colonoscopy I discussed risks of colonoscopy with patient to include risk of bleeding, colon perforation, and risk of  sedation.  Patient expressed understanding and agrees to proceed with colonoscopy.  Extra Prep: GoLytely   Follow up as needed.  Follow-up based on colonoscopy results.  Celso Amy, PA-C

## 2023-01-27 ENCOUNTER — Ambulatory Visit: Payer: Medicare PPO | Admitting: Physician Assistant

## 2023-01-27 ENCOUNTER — Encounter: Payer: Self-pay | Admitting: Physician Assistant

## 2023-01-27 VITALS — BP 104/72 | HR 90 | Temp 98.3°F | Ht 66.0 in | Wt 205.6 lb

## 2023-01-27 DIAGNOSIS — K5730 Diverticulosis of large intestine without perforation or abscess without bleeding: Secondary | ICD-10-CM

## 2023-01-27 DIAGNOSIS — Z1211 Encounter for screening for malignant neoplasm of colon: Secondary | ICD-10-CM

## 2023-01-27 MED ORDER — PEG 3350-KCL-NA BICARB-NACL 420 G PO SOLR: 4000.0000 mL | Freq: Once | ORAL | 0 refills | Status: AC

## 2023-03-08 ENCOUNTER — Telehealth: Payer: Self-pay

## 2023-03-08 NOTE — Telephone Encounter (Signed)
The patient is scheduled to see Julien Nordmann, MD on 03/16/2023 as a new patient. Called and left a voice message to inquire about any previous cardiac history. Requested the patient to call back at their earliest convenience.

## 2023-03-15 NOTE — Progress Notes (Unsigned)
Cardiology Office Note  Date:  03/16/2023   ID:  Deborah Hayes, DOB 16-Sep-1950, MRN [id number]  PCP:  Lonie Peak, PA-C   Chief Complaint  Patient presents with   New Patient (Initial Visit)    New patient evaluation for palpitation episodes.  Also concerned with Aortic Atherosclerosis seen on lung screening CT.  While lying in bed has bilateral arm/hand tingles.      HPI:  Deborah Hayes is a 72 year old woman with past medical history of Smoking, emphysema Aortic atherosclerosis on CT scan Hyperlipidemia Who presents by referral from Lonie Peak for palpitations, aortic atherosclerosis  May 2024:worked hard in the backyard Was troubled at that time with joint pain when laying down appreciated palpitations, felt like "frogs" in the chest Fit bit recorded "atrial fibtillation" episodes lasting for several hours Went the whole night, had resolved when she woke up No further episodes since that time  Chest CT images reviewed Very mild aortic atherosclerosis, no significant coronary calcification noted  On metoprolol succinate 25 daily  Significant stressors at home Family history reviewed Mother with dementia Husband with dementia Son with autism, lives in facility  Lost 50 pounds in one year Through dietary changes, following weight watchers  Lab work reviewed Total cholesterol 154 LDL 70 on Lipitor 20 A1C 5.6  EKG personally reviewed by myself on todays visit EKG Interpretation Date/Time:  Wednesday March 16 2023 10:52:03 EDT Ventricular Rate:  66 PR Interval:  214 QRS Duration:  74 QT Interval:  376 QTC Calculation: 394 R Axis:   45  Text Interpretation: Sinus rhythm with 1st degree A-V block Septal infarct , age undetermined No previous ECGs available Confirmed by Julien Nordmann [phone]) on 03/16/2023 11:08:55 AM     PMH:   has a past medical history of Abnormal glucose, Anxiety, BPPV (benign paroxysmal positional vertigo), Degenerative disc  disease, lumbar, Dermatitis herpetiformis, Gout, dysplastic nevus (05/2015), Hypertension, Pure hypercholesterolemia, and Tobacco dependence.  PSH:    Past Surgical History:  Procedure Laterality Date   ANTERIOR CRUCIATE LIGAMENT REPAIR Left    CATARACT EXTRACTION Right 06/03/2020   CATARACT EXTRACTION W/PHACO Right 06/03/2020   Procedure: CATARACT EXTRACTION PHACO AND INTRAOCULAR LENS PLACEMENT (IOC) RIGHT 4.09 00:29.3;  Surgeon: Galen Manila, MD;  Location: [facility];  Service: Ophthalmology;  Laterality: Right;   CATARACT EXTRACTION W/PHACO Left 06/24/2020   Procedure: CATARACT EXTRACTION PHACO AND INTRAOCULAR LENS PLACEMENT (IOC) LEFT DIABETIC 2.62 00:26.1;  Surgeon: Galen Manila, MD;  Location: [facility];  Service: Ophthalmology;  Laterality: Left;   CESAREAN SECTION     COLONOSCOPY WITH PROPOFOL N/A 03/28/2015   Procedure: COLONOSCOPY WITH PROPOFOL;  Surgeon: Christena Deem, MD;  Location: [facility];  Service: Endoscopy;  Laterality: N/A;   TONSILLECTOMY     TOTAL HIP ARTHROPLASTY Right 2020    Current Outpatient Medications  Medication Sig Dispense Refill   allopurinol (ZYLOPRIM) 100 MG tablet Take 300 mg by mouth daily.      ASPIRIN 81 PO Take by mouth daily.     atorvastatin (LIPITOR) 20 MG tablet Take 20 mg by mouth daily.     Cholecalciferol (VITAMIN D3) 50 MCG (2000 UT) CAPS Take 1 capsule by mouth daily.     cyanocobalamin (VITAMIN B12) 500 MCG tablet Take 500 mcg by mouth daily.     losartan (COZAAR) 50 MG tablet Take 50 mg by mouth 2 (two) times daily.     Magnesium 250 MG TABS Take 1 tablet by mouth daily.  metoprolol succinate (TOPROL-XL) 25 MG 24 hr tablet Take 25 mg by mouth daily.     Multiple Vitamins-Minerals (CENTRUM SILVER PO) Take by mouth daily.     Omega-3 Fatty Acids (OMEGA-3 FISH OIL PO) Take 600 mg by mouth daily.     triamcinolone cream (KENALOG) 0.1 % Apply to aa's eczema QD-BID PRN up to 5d/wk. Avoid f/g/a. 80 g 5    Multiple Vitamins-Minerals (ZINC PO) Take by mouth. (Patient not taking: Reported on 03/16/2023)     niacin (VITAMIN B3) 500 MG tablet Take 500 mg by mouth at bedtime. (Patient not taking: Reported on 03/16/2023)     No current facility-administered medications for this visit.     Allergies:   Sulfa antibiotics   Social History:  The patient  reports that she quit smoking about 8 years ago. Her smoking use included cigarettes. She started smoking about 55 years ago. She has a 47 pack-year smoking history. She has never used smokeless tobacco. She reports that she does not currently use alcohol. She reports that she does not use drugs.   Family History:   family history includes Atrial fibrillation in her mother; Dementia in her mother.    Review of Systems: Review of Systems  Constitutional: Negative.   HENT: Negative.    Respiratory: Negative.    Cardiovascular: Negative.   Gastrointestinal: Negative.   Musculoskeletal: Negative.   Neurological: Negative.   Psychiatric/Behavioral: Negative.    All other systems reviewed and are negative.    PHYSICAL EXAM: VS:  BP 124/72 (BP Location: Right Arm, Patient Position: Sitting, Cuff Size: Large)   Pulse 66   Ht 5\' 6"  (1.676 m)   Wt 204 lb 3.2 oz (92.6 kg)   SpO2 98%   BMI 32.96 kg/m  , BMI Body mass index is 32.96 kg/m. GEN: Well nourished, well developed, in no acute distress HEENT: normal Neck: no JVD, carotid bruits, or masses Cardiac: RRR; no murmurs, rubs, or gallops,no edema  Respiratory:  clear to auscultation bilaterally, normal work of breathing GI: soft, nontender, nondistended, + BS MS: no deformity or atrophy Skin: warm and dry, no rash Neuro:  Strength and sensation are intact Psych: euthymic mood, full affect  Recent Labs: No results found for requested labs within last 365 days.    Lipid Panel No results found for: "CHOL", "HDL", "LDLCALC", "TRIG"    Wt Readings from Last 3 Encounters:  03/16/23 204  lb 3.2 oz (92.6 kg)  01/27/23 205 lb 9.6 oz (93.3 kg)  12/29/22 200 lb 6.4 oz (90.9 kg)     ASSESSMENT AND PLAN:  Problem List Items Addressed This Visit   None Visit Diagnoses     Aortic atherosclerosis (HCC)    -  Primary   Palpitations       Relevant Orders   EKG 12-Lead (Completed)   LONG TERM MONITOR (3-14 DAYS)   Mixed hyperlipidemia          Aortic atherosclerosis CT scan images pulled up, minimal plaque noted Minimal coronary calcification noted Former smoker, cholesterol at goal on Lipitor 20  Palpitations Episode of irregular heartbeat caught in May 2024 on her Fitbit, watch detailing atrial fibrillation though no EKG strips available No further episodes since that time Stressors on that day after working hard in the garden, also had diffuse joint pains Recommended we order a Zio monitor for further evaluation Consider changing her Fitbit over to [employer] if possible Full recurrent tachypalpitations consider taking extra dose metoprolol for symptom  relief Would consider echocardiogram for additional episodes to rule out structural heart disease   Hyperlipidemia Continue Lipitor 20    Total encounter time more than 60 minutes  Greater than 50% was spent in counseling and coordination of care with the patient    Signed, Dossie Arbour, M.D., Ph.D. [facility] [place], [place] [phone]

## 2023-03-16 ENCOUNTER — Encounter: Payer: Self-pay | Admitting: Cardiovascular Disease

## 2023-03-16 ENCOUNTER — Ambulatory Visit: Payer: Medicare PPO | Attending: Cardiovascular Disease | Admitting: Cardiovascular Disease

## 2023-03-16 ENCOUNTER — Ambulatory Visit (INDEPENDENT_AMBULATORY_CARE_PROVIDER_SITE_OTHER): Payer: Medicare PPO

## 2023-03-16 VITALS — BP 124/72 | HR 66 | Ht 66.0 in | Wt 204.2 lb

## 2023-03-16 DIAGNOSIS — E782 Mixed hyperlipidemia: Secondary | ICD-10-CM

## 2023-03-16 DIAGNOSIS — R002 Palpitations: Secondary | ICD-10-CM

## 2023-03-16 DIAGNOSIS — I700 Atherosclerosis of aorta: Secondary | ICD-10-CM

## 2023-03-16 NOTE — Patient Instructions (Addendum)
Medication Instructions:  No changes  If you need a refill on your cardiac medications before your next appointment, please call your pharmacy.   Lab work: No new labs needed  Testing/Procedures:  ZIO XT- Long Term Monitor Instructions  Your physician has requested you wear a ZIO patch monitor for 14 days.  This is a single patch monitor. [employer] supplies one patch monitor per enrollment. Additional stickers are not available. Please do not apply patch if you will be having a Nuclear Stress Test,  Echocardiogram, Cardiac CT, MRI, or Chest Xray during the period you would be wearing the  monitor. The patch cannot be worn during these tests. You cannot remove and re-apply the  ZIO XT patch monitor.  Your ZIO patch monitor will be mailed 3 day USPS to your address on file. It may take 3-5 days  to receive your monitor after you have been enrolled.  Once you have received your monitor, please review the enclosed instructions. Your monitor  has already been registered assigning a specific monitor serial # to you.  Billing and Patient Assistance Program Information  We have supplied [employer] with any of your insurance information on file for billing purposes. [employer] offers a sliding scale Patient Assistance Program for patients that do not have  insurance, or whose insurance does not completely cover the cost of the ZIO monitor.  You must apply for the Patient Assistance Program to qualify for this discounted rate.  To apply, please call [employer] at [phone], select option 4, select option 2, ask to apply for  Patient Assistance Program. [employer] will ask your household income, and how many people  are in your household. They will quote your out-of-pocket cost based on that information.  [employer] will also be able to set up a 12-month, interest-free payment plan if needed.  Applying the monitor   Shave hair from upper left chest.  Hold abrader disc by orange tab. Rub abrader in 40  strokes over the upper left chest as  indicated in your monitor instructions.  Clean area with 4 enclosed alcohol pads. Let dry.  Apply patch as indicated in monitor instructions. Patch will be placed under collarbone on left  side of chest with arrow pointing upward.  Rub patch adhesive wings for 2 minutes. Remove white label marked "1". Remove the white  label marked "2". Rub patch adhesive wings for 2 additional minutes.  While looking in a mirror, press and release button in center of patch. A small green light will  flash 3-4 times. This will be your only indicator that the monitor has been turned on.  Do not shower for the first 24 hours. You may shower after the first 24 hours.  Press the button if you feel a symptom. You will hear a small click. Record Date, Time and  Symptom in the Patient Logbook.  When you are ready to remove the patch, follow instructions on the last 2 pages of Patient  Logbook. Stick patch monitor onto the last page of Patient Logbook.  Place Patient Logbook in the blue and white box. Use locking tab on box and tape box closed  securely. The blue and white box has prepaid postage on it. Please place it in the mailbox as  soon as possible. Your physician should have your test results approximately 7 days after the  monitor has been mailed back to [employer].  Call [employer] Customer Care at [phone] if you have questions regarding  your ZIO XT patch monitor.   Call them immediately if you see an orange light blinking on your  monitor.  If your monitor falls off in less than 4 days, contact our Monitor department at [phone].  If your monitor becomes loose or falls off after 4 days call [employer] at [phone] for  suggestions on securing your monitor   Follow-Up: At [facility], you and your health needs are our priority.  As part of our continuing mission to provide you with exceptional heart care, we have created designated Provider Care  Teams.  These Care Teams include your primary Cardiologist (physician) and Advanced Practice Providers (APPs -  Physician Assistants and Nurse Practitioners) who all work together to provide you with the care you need, when you need it.  You will need a follow up appointment in 12 months  Providers on your designated Care Team:   Christopher Berge, NP Ryan Dunn, PA-C Cadence Furth, PA-C  COVID-19 Vaccine Information can be found at: [email or web address] For questions related to vaccine distribution or appointments, please email [email or web address] or call [phone].   

## 2023-03-17 ENCOUNTER — Encounter: Payer: Self-pay | Admitting: Gastroenterology

## 2023-03-17 ENCOUNTER — Other Ambulatory Visit: Payer: Self-pay

## 2023-03-24 ENCOUNTER — Encounter
Admission: RE | Disposition: A | Discharge status: Home / Self Care | Payer: Self-pay | Source: Home / Self Care | Attending: Gastroenterology

## 2023-03-24 ENCOUNTER — Ambulatory Visit: Payer: Medicare PPO | Admitting: Anesthesiology

## 2023-03-24 ENCOUNTER — Ambulatory Visit
Admission: RE | Admit: 2023-03-24 | Discharge: 2023-03-24 | Disposition: A | Discharge status: Home / Self Care | Payer: Medicare PPO | Attending: Gastroenterology | Admitting: Gastroenterology

## 2023-03-24 ENCOUNTER — Encounter: Payer: Self-pay | Admitting: Gastroenterology

## 2023-03-24 DIAGNOSIS — I129 Hypertensive chronic kidney disease with stage 1 through stage 4 chronic kidney disease, or unspecified chronic kidney disease: Secondary | ICD-10-CM | POA: Insufficient documentation

## 2023-03-24 DIAGNOSIS — Z87891 Personal history of nicotine dependence: Secondary | ICD-10-CM | POA: Diagnosis not present

## 2023-03-24 DIAGNOSIS — Z79899 Other long term (current) drug therapy: Secondary | ICD-10-CM | POA: Insufficient documentation

## 2023-03-24 DIAGNOSIS — Z1211 Encounter for screening for malignant neoplasm of colon: Secondary | ICD-10-CM

## 2023-03-24 DIAGNOSIS — K5730 Diverticulosis of large intestine without perforation or abscess without bleeding: Secondary | ICD-10-CM

## 2023-03-24 DIAGNOSIS — Z09 Encounter for follow-up examination after completed treatment for conditions other than malignant neoplasm: Secondary | ICD-10-CM | POA: Diagnosis not present

## 2023-03-24 DIAGNOSIS — K5792 Diverticulitis of intestine, part unspecified, without perforation or abscess without bleeding: Secondary | ICD-10-CM | POA: Diagnosis present

## 2023-03-24 DIAGNOSIS — N189 Chronic kidney disease, unspecified: Secondary | ICD-10-CM | POA: Insufficient documentation

## 2023-03-24 HISTORY — PX: COLONOSCOPY WITH PROPOFOL: SHX5780

## 2023-03-24 SURGERY — COLONOSCOPY WITH PROPOFOL
Anesthesia: General

## 2023-03-24 MED ORDER — PROPOFOL 1000 MG/100ML IV EMUL
INTRAVENOUS | Status: AC
  Filled 2023-03-24: qty 100

## 2023-03-24 MED ORDER — LIDOCAINE HCL (PF) 2 % IJ SOLN
INTRAMUSCULAR | Status: AC
  Filled 2023-03-24: qty 5

## 2023-03-24 MED ORDER — PROPOFOL 500 MG/50ML IV EMUL
INTRAVENOUS | Status: DC | PRN
  Administered 2023-03-24: 125 ug/kg/min via INTRAVENOUS

## 2023-03-24 MED ORDER — LIDOCAINE HCL (CARDIAC) PF 100 MG/5ML IV SOSY
PREFILLED_SYRINGE | INTRAVENOUS | Status: DC | PRN
  Administered 2023-03-24: 50 mg via INTRAVENOUS

## 2023-03-24 MED ORDER — DEXMEDETOMIDINE HCL IN NACL 80 MCG/20ML IV SOLN
INTRAVENOUS | Status: DC | PRN
  Administered 2023-03-24: 16 ug via INTRAVENOUS

## 2023-03-24 MED ORDER — PROPOFOL 10 MG/ML IV BOLUS
INTRAVENOUS | Status: DC | PRN
  Administered 2023-03-24: 40 mg via INTRAVENOUS
  Administered 2023-03-24: 60 mg via INTRAVENOUS

## 2023-03-24 MED ORDER — SODIUM CHLORIDE 0.9 % IV SOLN
INTRAVENOUS | Status: DC
  Administered 2023-03-24: 1000 mL via INTRAVENOUS

## 2023-03-24 NOTE — Transfer of Care (Signed)
Immediate Anesthesia Transfer of Care Note  Patient: Deborah Hayes  Procedure(s) Performed: COLONOSCOPY WITH PROPOFOL  Patient Location: PACU  Anesthesia Type:General  Level of Consciousness: oriented, drowsy, and patient cooperative  Airway & Oxygen Therapy: Patient Spontanous Breathing  Post-op Assessment: Report given to RN and Post -op Vital signs reviewed and stable  Post vital signs: Reviewed and stable  Last Vitals:  Vitals Value Taken Time  BP 92/71   Temp 36.1 C 03/24/23 0810  Pulse 74 03/24/23 0811  Resp 18 03/24/23 0811  SpO2 98 % 03/24/23 0811  Vitals shown include unfiled device data.  Last Pain:  Vitals:   03/24/23 0810  TempSrc: Temporal  PainSc: Asleep         Complications: No notable events documented.

## 2023-03-24 NOTE — Anesthesia Preprocedure Evaluation (Signed)
Anesthesia Evaluation  Patient identified by MRN, date of birth, ID band Patient awake    Reviewed: Allergy & Precautions, NPO status , Patient's Chart, lab work & pertinent test results  History of Anesthesia Complications Negative for: history of anesthetic complications  Airway Mallampati: II  TM Distance: >3 FB Neck ROM: Full    Dental  (+) Implants, Partial Lower, Dental Advidsory Given   Pulmonary neg shortness of breath, neg sleep apnea, neg COPD, neg recent URI, former smoker          Cardiovascular Exercise Tolerance: Good hypertension, Pt. on medications (-) angina (-) Past MI and (-) Cardiac Stents (-) dysrhythmias (-) Valvular Problems/Murmurs     Neuro/Psych  PSYCHIATRIC DISORDERS (pt denies) Anxiety     negative neurological ROS     GI/Hepatic negative GI ROS, Neg liver ROS,,,  Endo/Other  negative endocrine ROS    Renal/GU CRFRenal disease  negative genitourinary   Musculoskeletal   Abdominal   Peds negative pediatric ROS (+)  Hematology negative hematology ROS (+)   Anesthesia Other Findings Past Medical History: No date: Abnormal glucose No date: Anxiety No date: BPPV (benign paroxysmal positional vertigo)     Comment:  none for several years No date: Degenerative disc disease, lumbar No date: Dermatitis herpetiformis No date: Gout 05/2015: Hx of dysplastic nevus     Comment:  L buttock, mild atypia; L inferior buttock, moderate               atypia No date: Hypertension No date: Pure hypercholesterolemia No date: Tobacco dependence     Comment:  H/O   Reproductive/Obstetrics                             Anesthesia Physical Anesthesia Plan  ASA: 2  Anesthesia Plan: General   Post-op Pain Management:    Induction: Intravenous  PONV Risk Score and Plan: 3 and Propofol infusion and TIVA  Airway Management Planned: Natural Airway and Nasal  Cannula  Additional Equipment:   Intra-op Plan:   Post-operative Plan:   Informed Consent: I have reviewed the patients History and Physical, chart, labs and discussed the procedure including the risks, benefits and alternatives for the proposed anesthesia with the patient or authorized representative who has indicated his/her understanding and acceptance.       Plan Discussed with:   Anesthesia Plan Comments:         Anesthesia Quick Evaluation

## 2023-03-24 NOTE — H&P (Signed)
Wyline Mood, MD [address], [address], [place], [place], [address] [address], [address], [place], [place], [address] Phone: [phone]  Fax: [phone]  Primary Care Physician:  Lonie Peak, PA-C   Pre-Procedure History & Physical: HPI:  Deborah Hayes is a 72 y.o. female is here for an colonoscopy.   Past Medical History:  Diagnosis Date   Abnormal glucose    Anxiety    BPPV (benign paroxysmal positional vertigo)    none for several years   Degenerative disc disease, lumbar    Dermatitis herpetiformis    Gout    Hx of dysplastic nevus 05/2015   L buttock, mild atypia; L inferior buttock, moderate atypia   Hypertension    Pure hypercholesterolemia    Tobacco dependence    H/O    Past Surgical History:  Procedure Laterality Date   ANTERIOR CRUCIATE LIGAMENT REPAIR Left    CATARACT EXTRACTION Right 06/03/2020   CATARACT EXTRACTION W/PHACO Right 06/03/2020   Procedure: CATARACT EXTRACTION PHACO AND INTRAOCULAR LENS PLACEMENT (IOC) RIGHT 4.09 00:29.3;  Surgeon: Galen Manila, MD;  Location: [facility];  Service: Ophthalmology;  Laterality: Right;   CATARACT EXTRACTION W/PHACO Left 06/24/2020   Procedure: CATARACT EXTRACTION PHACO AND INTRAOCULAR LENS PLACEMENT (IOC) LEFT DIABETIC 2.62 00:26.1;  Surgeon: Galen Manila, MD;  Location: [facility];  Service: Ophthalmology;  Laterality: Left;   CESAREAN SECTION     COLONOSCOPY WITH PROPOFOL N/A 03/28/2015   Procedure: COLONOSCOPY WITH PROPOFOL;  Surgeon: Christena Deem, MD;  Location: [facility];  Service: Endoscopy;  Laterality: N/A;   EYE SURGERY     JOINT REPLACEMENT     TONSILLECTOMY     TOTAL HIP ARTHROPLASTY Right 2020    Prior to Admission medications   Medication Sig Start Date End Date Taking? Authorizing Provider  allopurinol (ZYLOPRIM) 100 MG tablet Take 300 mg by mouth daily.    Yes [provider]  ASPIRIN 81 PO Take by mouth daily.   Yes [provider]  atorvastatin (LIPITOR) 20 MG tablet Take 20 mg by mouth daily.   Yes [provider]  Cholecalciferol (VITAMIN D3) 50 MCG (2000 UT) CAPS Take 1 capsule by mouth daily.   Yes [provider]  cyanocobalamin (VITAMIN B12) 500 MCG tablet Take 500 mcg by mouth daily.   Yes [provider]  losartan (COZAAR) 50 MG tablet Take 50 mg by mouth 2 (two) times daily.   Yes [provider]  Magnesium 250 MG TABS Take 1 tablet by mouth daily.   Yes [provider]  metoprolol succinate (TOPROL-XL) 25 MG 24 hr tablet Take 25 mg by mouth daily.   Yes [provider]  Multiple Vitamins-Minerals (CENTRUM SILVER PO) Take by mouth daily.   Yes [provider]  Multiple Vitamins-Minerals (ZINC PO) Take by mouth.   Yes [provider]  Omega-3 Fatty Acids (OMEGA-3 FISH OIL PO) Take 600 mg by mouth daily.   Yes [provider]  triamcinolone cream (KENALOG) 0.1 % Apply to aa's eczema QD-BID PRN up to 5d/wk. Avoid f/g/a. 08/04/22  Yes Deirdre Evener, MD  niacin (VITAMIN B3) 500 MG tablet Take 500 mg by mouth at bedtime. Patient not taking: Reported on 03/16/2023    [provider]    Allergies as of 01/27/2023 - Review Complete 01/27/2023  Allergen Reaction Noted   Sulfa antibiotics Hives 03/27/2015    Family History  Problem Relation Age of Onset  Dementia Mother    Atrial fibrillation Mother     Social History   Socioeconomic History   Marital status: Married    Spouse name: Not on file   Number of children: Not on file   Years of education: Not on file   Highest education level: Not on file  Occupational History   Not on file  Tobacco Use   Smoking status: Former    Current packs/day: 0.00    Average packs/day: 1 pack/day for 47.0 years (47.0 ttl pk-yrs)    Types: Cigarettes    Start date: 04/1967    Quit date: 04/2014    Years since quitting: 8.9   Smokeless tobacco: Never  Vaping Use    Vaping status: Never Used  Substance and Sexual Activity   Alcohol use: Not Currently    Comment: rare   Drug use: Never   Sexual activity: Not on file  Other Topics Concern   Not on file  Social History Narrative   Not on file   Social Determinants of Health   Financial Resource Strain: Not on file  Food Insecurity: Not on file  Transportation Needs: Not on file  Physical Activity: Not on file  Stress: Not on file  Social Connections: Not on file  Intimate Partner Violence: Not on file    Review of Systems: See HPI, otherwise negative ROS  Physical Exam: BP (!) 161/96   Pulse 73   Temp (!) 96.9 F (36.1 C) (Temporal)   Resp 18   Ht 5\' 6"  (1.676 m)   Wt 93 kg   SpO2 99%   BMI 33.09 kg/m  General:   Alert,  pleasant and cooperative in NAD Head:  Normocephalic and atraumatic. Neck:  Supple; no masses or thyromegaly. Lungs:  Clear throughout to auscultation, normal respiratory effort.    Heart:  +S1, +S2, Regular rate and rhythm, No edema. Abdomen:  Soft, nontender and nondistended. Normal bowel sounds, without guarding, and without rebound.   Neurologic:  Alert and  oriented x4;  grossly normal neurologically.  Impression/Plan: Deborah Hayes is here for an colonoscopy to be performed for diverticulitis Risks, benefits, limitations, and alternatives regarding  colonoscopy have been reviewed with the patient.  Questions have been answered.  All parties agreeable.   Wyline Mood, MD  03/24/2023, 7:44 AM

## 2023-03-24 NOTE — Op Note (Signed)
[facility] Gastroenterology Patient Name: Andjela Hjorth Procedure Date: 03/24/2023 7:23 AM MRN: [id number] Account #: [id number] Date of Birth: 28-Jul-1951 Admit Type: Outpatient Age: 72 Room: [facility] ENDO ROOM 3 Gender: Female Note Status: Finalized Instrument Name: Colonoscope [id number],Peds Colonoscope [id number] Procedure:             Colonoscopy Indications:           Follow-up of diverticulitis Providers:             Wyline Mood MD, MD Referring MD:          No Local Md, MD (Referring MD) Medicines:             Monitored Anesthesia Care Complications:         No immediate complications. Procedure:             Pre-Anesthesia Assessment:                        - Prior to the procedure, a History and Physical was                         performed, and patient medications, allergies and                         sensitivities were reviewed. The patient's tolerance                         of previous anesthesia was reviewed.                        - The risks and benefits of the procedure and the                         sedation options and risks were discussed with the                         patient. All questions were answered and informed                         consent was obtained.                        - ASA Grade Assessment: II - A patient with mild                         systemic disease.                        After obtaining informed consent, the colonoscope was                         passed under direct vision. Throughout the procedure,                         the patient's blood pressure, pulse, and oxygen                         saturations were monitored continuously. The                         Colonoscope was  introduced through the anus and                         advanced to the the cecum, identified by the                         appendiceal orifice. The Colonoscope was introduced                         through the and advanced to the the cecum,  identified                         by the appendiceal orifice. The colonoscopy was                         performed with ease. The patient tolerated the                         procedure well. The quality of the bowel preparation                         was adequate. The ileocecal valve, appendiceal                         orifice, and rectum were photographed. Findings:      The perianal and digital rectal examinations were normal.      Multiple large-mouthed, medium-mouthed and small-mouthed diverticula       were found in the entire colon.      The exam was otherwise without abnormality on direct and retroflexion       views. Impression:            - Diverticulosis in the entire examined colon.                        - The examination was otherwise normal on direct and                         retroflexion views.                        - No specimens collected. Recommendation:        - Discharge patient to home (with escort).                        - Resume previous diet.                        - Continue present medications.                        - Repeat colonoscopy is not recommended due to current                         age (29 years or older) for screening purposes. Procedure Code(s):     --- Professional ---                        [phone], Colonoscopy, flexible; diagnostic, including  collection of specimen(s) by brushing or washing, when                         performed (separate procedure) Diagnosis Code(s):     --- Professional ---                        Q46.96, Diverticulitis of large intestine without                         perforation or abscess without bleeding                        K57.30, Diverticulosis of large intestine without                         perforation or abscess without bleeding CPT copyright 2022 American Medical Association. All rights reserved. The codes documented in this report are preliminary and upon coder review may  be  revised to meet current compliance requirements. Wyline Mood, MD Wyline Mood MD, MD 03/24/2023 8:10:10 AM This report has been signed electronically. Number of Addenda: 0 Note Initiated On: 03/24/2023 7:23 AM Scope Withdrawal Time: 0 hours 6 minutes 13 seconds  Total Procedure Duration: 0 hours 17 minutes 3 seconds  Estimated Blood Loss:  Estimated blood loss: none.      [facility]

## 2023-03-25 ENCOUNTER — Encounter: Payer: Self-pay | Admitting: Gastroenterology

## 2023-03-25 DIAGNOSIS — R002 Palpitations: Secondary | ICD-10-CM

## 2023-03-30 NOTE — Anesthesia Postprocedure Evaluation (Signed)
Anesthesia Post Note  Patient: Deborah Hayes  Procedure(s) Performed: COLONOSCOPY WITH PROPOFOL  Patient location during evaluation: Endoscopy Anesthesia Type: General Level of consciousness: awake and alert Pain management: pain level controlled Vital Signs Assessment: post-procedure vital signs reviewed and stable Respiratory status: spontaneous breathing, nonlabored ventilation, respiratory function stable and patient connected to nasal cannula oxygen Cardiovascular status: blood pressure returned to baseline and stable Postop Assessment: no apparent nausea or vomiting Anesthetic complications: no   No notable events documented.   Last Vitals:  Vitals:   03/24/23 0820 03/24/23 0830  BP: 114/83 114/79  Pulse: 66 62  Resp: 14 13  Temp: (!) 36.2 C (!) 36.2 C  SpO2: 97% 95%    Last Pain:  Vitals:   03/25/23 0724  TempSrc:   PainSc: 0-No pain                 Lenard Simmer

## 2023-04-13 DIAGNOSIS — R002 Palpitations: Secondary | ICD-10-CM | POA: Diagnosis not present

## 2023-06-14 DIAGNOSIS — M109 Gout, unspecified: Secondary | ICD-10-CM | POA: Diagnosis not present

## 2023-06-14 DIAGNOSIS — R7303 Prediabetes: Secondary | ICD-10-CM | POA: Diagnosis not present

## 2023-06-14 DIAGNOSIS — I10 Essential (primary) hypertension: Secondary | ICD-10-CM | POA: Diagnosis not present

## 2023-06-14 DIAGNOSIS — E7800 Pure hypercholesterolemia, unspecified: Secondary | ICD-10-CM | POA: Diagnosis not present

## 2023-06-28 DIAGNOSIS — I700 Atherosclerosis of aorta: Secondary | ICD-10-CM | POA: Diagnosis not present

## 2023-06-28 DIAGNOSIS — R002 Palpitations: Secondary | ICD-10-CM | POA: Diagnosis not present

## 2023-06-28 DIAGNOSIS — Z139 Encounter for screening, unspecified: Secondary | ICD-10-CM | POA: Diagnosis not present

## 2023-06-28 DIAGNOSIS — I10 Essential (primary) hypertension: Secondary | ICD-10-CM | POA: Diagnosis not present

## 2023-06-28 DIAGNOSIS — R7303 Prediabetes: Secondary | ICD-10-CM | POA: Diagnosis not present

## 2023-06-28 DIAGNOSIS — M109 Gout, unspecified: Secondary | ICD-10-CM | POA: Diagnosis not present

## 2023-06-28 DIAGNOSIS — I2510 Atherosclerotic heart disease of native coronary artery without angina pectoris: Secondary | ICD-10-CM | POA: Diagnosis not present

## 2023-06-28 DIAGNOSIS — E7800 Pure hypercholesterolemia, unspecified: Secondary | ICD-10-CM | POA: Diagnosis not present

## 2023-06-28 DIAGNOSIS — Z1331 Encounter for screening for depression: Secondary | ICD-10-CM | POA: Diagnosis not present

## 2023-08-04 DIAGNOSIS — H43813 Vitreous degeneration, bilateral: Secondary | ICD-10-CM | POA: Diagnosis not present

## 2023-08-04 DIAGNOSIS — H35413 Lattice degeneration of retina, bilateral: Secondary | ICD-10-CM | POA: Diagnosis not present

## 2023-08-04 DIAGNOSIS — M3501 Sicca syndrome with keratoconjunctivitis: Secondary | ICD-10-CM | POA: Diagnosis not present

## 2023-08-04 DIAGNOSIS — H179 Unspecified corneal scar and opacity: Secondary | ICD-10-CM | POA: Diagnosis not present

## 2023-08-10 ENCOUNTER — Ambulatory Visit: Payer: Medicare PPO | Admitting: Dermatology

## 2023-11-16 ENCOUNTER — Encounter: Payer: Self-pay | Admitting: Obstetrics

## 2023-11-16 ENCOUNTER — Ambulatory Visit
Admission: RE | Admit: 2023-11-16 | Discharge: 2023-11-16 | Disposition: A | Discharge status: Home / Self Care | Source: Ambulatory Visit | Attending: Acute Care | Admitting: Acute Care

## 2023-11-16 DIAGNOSIS — Z122 Encounter for screening for malignant neoplasm of respiratory organs: Secondary | ICD-10-CM | POA: Diagnosis not present

## 2023-11-16 DIAGNOSIS — F17210 Nicotine dependence, cigarettes, uncomplicated: Secondary | ICD-10-CM | POA: Diagnosis not present

## 2023-11-16 DIAGNOSIS — Z1231 Encounter for screening mammogram for malignant neoplasm of breast: Secondary | ICD-10-CM

## 2023-11-16 DIAGNOSIS — Z87891 Personal history of nicotine dependence: Secondary | ICD-10-CM | POA: Diagnosis not present

## 2023-11-29 ENCOUNTER — Ambulatory Visit: Admitting: Obstetrics

## 2023-11-29 ENCOUNTER — Other Ambulatory Visit: Payer: Self-pay | Admitting: Obstetrics

## 2023-11-29 ENCOUNTER — Encounter: Payer: Self-pay | Admitting: Obstetrics

## 2023-11-29 VITALS — BP 129/86 | HR 72 | Ht 66.0 in | Wt 214.0 lb

## 2023-11-29 DIAGNOSIS — N6341 Unspecified lump in right breast, subareolar: Secondary | ICD-10-CM

## 2023-11-29 NOTE — Progress Notes (Signed)
   GYN ENCOUNTER  Subjective  HPI: Deborah Hayes is a 73 y.o. G2P1011 who presents today for a breast exam and referral for diagnostic mammogram. A few weeks ago, she noticed a pea-sized lump behind her right nipple. She denies pain, discharge, or other breast changes. She has a history of lumpectomy for a benign calcification on the outer quadrant of that breast.   Past Medical History:  Diagnosis Date   Abnormal glucose    Anxiety    BPPV (benign paroxysmal positional vertigo)    none for several years   Degenerative disc disease, lumbar    Dermatitis herpetiformis    Gout    Hx of dysplastic nevus 05/2015   L buttock, mild atypia; L inferior buttock, moderate atypia   Hypertension    Pure hypercholesterolemia    Tobacco dependence    H/O   Past Surgical History:  Procedure Laterality Date   ANTERIOR CRUCIATE LIGAMENT REPAIR Left    CATARACT EXTRACTION Right 06/03/2020   CATARACT EXTRACTION W/PHACO Right 06/03/2020   Procedure: CATARACT EXTRACTION PHACO AND INTRAOCULAR LENS PLACEMENT (IOC) RIGHT 4.09 00:29.3;  Surgeon: Clair Crews, MD;  Location: [facility];  Service: Ophthalmology;  Laterality: Right;   CATARACT EXTRACTION W/PHACO Left 06/24/2020   Procedure: CATARACT EXTRACTION PHACO AND INTRAOCULAR LENS PLACEMENT (IOC) LEFT DIABETIC 2.62 00:26.1;  Surgeon: Clair Crews, MD;  Location: [facility];  Service: Ophthalmology;  Laterality: Left;   CESAREAN SECTION     COLONOSCOPY WITH PROPOFOL  N/A 03/28/2015   Procedure: COLONOSCOPY WITH PROPOFOL ;  Surgeon: Deveron Fly, MD;  Location: [facility] ENDOSCOPY;  Service: Endoscopy;  Laterality: N/A;   COLONOSCOPY WITH PROPOFOL  N/A 03/24/2023   Procedure: COLONOSCOPY WITH PROPOFOL ;  Surgeon: Luke Salaam, MD;  Location: [facility] ENDOSCOPY;  Service: Gastroenterology;  Laterality: N/A;   EYE SURGERY     JOINT REPLACEMENT     TONSILLECTOMY     TOTAL HIP ARTHROPLASTY Right 2020   OB History     Gravida  2    Para  1   Term  1   Preterm      AB  1   Living  1      SAB  1   IAB      Ectopic      Multiple      Live Births  1          Allergies  Allergen Reactions   Sulfa Antibiotics Hives    Childhood reaction    ROS: See HPI  Objective  BP 129/86   Pulse 72   Ht 5\' 6"  (1.676 m)   Wt 214 lb (97.1 kg)   BMI 34.54 kg/m   Physical examination   Breasts  Symmetric, no lesions, nipple discharges, or skin changes. Small, hard, pea-sized mass palpable under the inner quadrant of the right areola.    Assessment Breast mass  Plan Diagnostic mammogram ordered   Josue Nip, CNM

## 2023-12-01 ENCOUNTER — Ambulatory Visit
Admission: RE | Admit: 2023-12-01 | Discharge: 2023-12-01 | Disposition: A | Discharge status: Home / Self Care | Source: Ambulatory Visit | Attending: Obstetrics | Admitting: Obstetrics

## 2023-12-01 DIAGNOSIS — N6341 Unspecified lump in right breast, subareolar: Secondary | ICD-10-CM | POA: Insufficient documentation

## 2023-12-01 DIAGNOSIS — R92323 Mammographic fibroglandular density, bilateral breasts: Secondary | ICD-10-CM | POA: Diagnosis not present

## 2023-12-01 DIAGNOSIS — N6312 Unspecified lump in the right breast, upper inner quadrant: Secondary | ICD-10-CM | POA: Diagnosis not present

## 2023-12-09 ENCOUNTER — Telehealth: Payer: Self-pay

## 2023-12-09 NOTE — Telephone Encounter (Signed)
 Copied from CRM [id number]. Topic: Clinical - Lab/Test Results >> Dec 09, 2023 11:14 AM Isabell A wrote: Reason for CRM: Patient is requesting to speak with a nurse to discuss her lung cancer screening results.

## 2023-12-15 ENCOUNTER — Other Ambulatory Visit: Payer: Self-pay

## 2023-12-15 DIAGNOSIS — Z87891 Personal history of nicotine dependence: Secondary | ICD-10-CM

## 2023-12-15 DIAGNOSIS — Z122 Encounter for screening for malignant neoplasm of respiratory organs: Secondary | ICD-10-CM

## 2023-12-15 DIAGNOSIS — F17210 Nicotine dependence, cigarettes, uncomplicated: Secondary | ICD-10-CM

## 2023-12-27 DIAGNOSIS — R7303 Prediabetes: Secondary | ICD-10-CM | POA: Diagnosis not present

## 2023-12-27 DIAGNOSIS — I10 Essential (primary) hypertension: Secondary | ICD-10-CM | POA: Diagnosis not present

## 2023-12-27 DIAGNOSIS — E7800 Pure hypercholesterolemia, unspecified: Secondary | ICD-10-CM | POA: Diagnosis not present

## 2023-12-27 DIAGNOSIS — M109 Gout, unspecified: Secondary | ICD-10-CM | POA: Diagnosis not present

## 2023-12-28 ENCOUNTER — Encounter: Payer: Self-pay | Admitting: Dermatology

## 2023-12-28 ENCOUNTER — Ambulatory Visit: Payer: Medicare PPO | Admitting: Dermatology

## 2023-12-28 DIAGNOSIS — L299 Pruritus, unspecified: Secondary | ICD-10-CM

## 2023-12-28 DIAGNOSIS — D229 Melanocytic nevi, unspecified: Secondary | ICD-10-CM

## 2023-12-28 DIAGNOSIS — L821 Other seborrheic keratosis: Secondary | ICD-10-CM | POA: Diagnosis not present

## 2023-12-28 DIAGNOSIS — D692 Other nonthrombocytopenic purpura: Secondary | ICD-10-CM

## 2023-12-28 DIAGNOSIS — R202 Paresthesia of skin: Secondary | ICD-10-CM

## 2023-12-28 DIAGNOSIS — Z1283 Encounter for screening for malignant neoplasm of skin: Secondary | ICD-10-CM

## 2023-12-28 DIAGNOSIS — Z86018 Personal history of other benign neoplasm: Secondary | ICD-10-CM

## 2023-12-28 DIAGNOSIS — L209 Atopic dermatitis, unspecified: Secondary | ICD-10-CM

## 2023-12-28 DIAGNOSIS — L814 Other melanin hyperpigmentation: Secondary | ICD-10-CM | POA: Diagnosis not present

## 2023-12-28 DIAGNOSIS — W908XXA Exposure to other nonionizing radiation, initial encounter: Secondary | ICD-10-CM | POA: Diagnosis not present

## 2023-12-28 DIAGNOSIS — L719 Rosacea, unspecified: Secondary | ICD-10-CM | POA: Diagnosis not present

## 2023-12-28 DIAGNOSIS — L820 Inflamed seborrheic keratosis: Secondary | ICD-10-CM | POA: Diagnosis not present

## 2023-12-28 DIAGNOSIS — L578 Other skin changes due to chronic exposure to nonionizing radiation: Secondary | ICD-10-CM | POA: Diagnosis not present

## 2023-12-28 DIAGNOSIS — Z7189 Other specified counseling: Secondary | ICD-10-CM

## 2023-12-28 DIAGNOSIS — H02402 Unspecified ptosis of left eyelid: Secondary | ICD-10-CM

## 2023-12-28 DIAGNOSIS — D1801 Hemangioma of skin and subcutaneous tissue: Secondary | ICD-10-CM | POA: Diagnosis not present

## 2023-12-28 DIAGNOSIS — Z79899 Other long term (current) drug therapy: Secondary | ICD-10-CM

## 2023-12-28 DIAGNOSIS — I781 Nevus, non-neoplastic: Secondary | ICD-10-CM

## 2023-12-28 NOTE — Progress Notes (Signed)
 Follow-Up Visit   Subjective  Deborah Hayes is a 73 y.o. female who presents for the following: Skin Cancer Screening and Full Body Skin Exam Hx of notalgia paresthetica Hx of dysplastic nevi  The patient presents for Total-Body Skin Exam (TBSE) for skin cancer screening and mole check. The patient has spots, moles and lesions to be evaluated, some may be new or changing and the patient may have concern these could be cancer.  The following portions of the chart were reviewed this encounter and updated as appropriate: medications, allergies, medical history  Review of Systems:  No other skin or systemic complaints except as noted in HPI or Assessment and Plan.  Objective  Well appearing patient in no apparent distress; mood and affect are within normal limits.  A full examination was performed including scalp, head, eyes, ears, nose, lips, neck, chest, axillae, abdomen, back, buttocks, bilateral upper extremities, bilateral lower extremities, hands, feet, fingers, toes, fingernails, and toenails. All findings within normal limits unless otherwise noted below.   Relevant physical exam findings are noted in the Assessment and Plan.  left temple/cheek x 5 (5) Erythematous stuck-on, waxy papule or plaque  Assessment & Plan   SKIN CANCER SCREENING PERFORMED TODAY.  ACTINIC DAMAGE - Chronic condition, secondary to cumulative UV/sun exposure - diffuse scaly erythematous macules with underlying dyspigmentation - Recommend daily broad spectrum sunscreen SPF 30+ to sun-exposed areas, reapply every 2 hours as needed.  - Staying in the shade or wearing long sleeves, sun glasses (UVA+UVB protection) and wide brim hats (4-inch brim around the entire circumference of the hat) are also recommended for sun protection.  - Call for new or changing lesions.  LENTIGINES, SEBORRHEIC KERATOSES, HEMANGIOMAS - Benign normal skin lesions - Benign-appearing - Call for any changes  MELANOCYTIC  NEVI - Tan-brown and/or pink-flesh-colored symmetric macules and papules - Benign appearing on exam today - Observation - Call clinic for new or changing moles - Recommend daily use of broad spectrum spf 30+ sunscreen to sun-exposed areas.   Purpura - Chronic; persistent and recurrent.  Treatable, but not curable. - Violaceous macules and patches - Benign - Related to trauma, age, sun damage and/or use of blood thinners, chronic use of topical and/or oral steroids - Observe - Can use OTC arnica containing moisturizer such as Dermend Bruise Formula if desired - Call for worsening or other concerns  Drooping eyelid on Left.  Discussed blepharoplasty.  Recommend she be evaluated by her ophthalmologist to be considered for visual field testings which may determine if she is a candidate for medical blepharoplasty.  Otherwise it would likely be considered cosmetic.  Blepharoplasties are typically done by ophthalmologist or plastic surgeons or cosmetic surgeons.    ROSACEA Exam Mid face erythema with telangiectasias  Chronic and persistent condition with duration or expected duration over one year. Condition is symptomatic / bothersome to patient. Not to goal. Rosacea is a chronic progressive skin condition usually affecting the face of adults, causing redness and/or acne bumps. It is treatable but not curable. It sometimes affects the eyes (ocular rosacea) as well. It may respond to topical and/or systemic medication and can flare with stress, sun exposure, alcohol, exercise, topical steroids (including hydrocortisone/cortisone 10) and some foods.  Daily application of broad spectrum spf 30+ sunscreen to face is recommended to reduce flares.  Patient denies grittiness of the eyes   Treatment Plan mild Discussed laser Counseling for BBL / IPL / Laser and Coordination of Care Discussed the treatment option of  Broad Band Light (BBL) /Intense Pulsed Light (IPL)/ Laser for skin discoloration,  including brown spots and redness.  Typically we recommend at least 1-3 treatment sessions about 5-8 weeks apart for best results.  Cannot have tanned skin when BBL performed, and regular use of sunscreen/photoprotection is advised after the procedure to help maintain results. The patient's condition may also require "maintenance treatments" in the future.  The fee for BBL / laser treatments is $350 per treatment session for the whole face.  A fee can be quoted for other parts of the body.  Insurance typically does not pay for BBL/laser treatments and therefore the fee is an out-of-pocket cost. Recommend prophylactic valtrex treatment. Once scheduled for procedure, will send Rx in prior to patient's appointment.    HISTORY OF DYSPLASTIC NEVUS 05/2015 left buttock mild, left inferor buttock moderate atypia  No evidence of recurrence today Recommend regular full body skin exams Recommend daily broad spectrum sunscreen SPF 30+ to sun-exposed areas, reapply every 2 hours as needed.  Call if any new or changing lesions are noted between office visits  Notalgia paresthetica Mid Back With atopic dermatitis and pruritus and Dyschromia -  Hyperpigmented macule at right upper back  Notalgia paresthetica is a chronic condition affecting the skin of the back in which a pinched nerve along the spine causes itching or changes in sensation in an area of skin. This is usually accompanied by chronic rubbing or scratching often leaving the area of skin discolored and thickened. There is no cure, but there are some treatments which may help control the itch.   Over the counter (non-prescription) treatments for notalgia paresthetica include numbing creams like pramoxine or lidocaine  which temporarily reduce itch or Capsaicin-containing creams which cause a burning sensation but which sometimes over time will reset the nerves to stop producing itch.  If you choose to use Capsaicin cream, it is recommended to use it 5  times daily for 1 week followed by 3 times daily for 3-6 weeks. You may have to continue using it long-term. For severe cases, there are some prescription cream or pill options which may help. Other treatment options include: - Transcutaneous Electrical Nerve Stimulation (TENS) - Gabapentin 300-900 mg daily po - Amitriptyline orally - Paravertebral local anesthetic block - intralesional Botulinum toxin A   Atopic dermatitis (eczema) is a chronic, relapsing, pruritic condition that can significantly affect quality of life. It is often associated with allergic rhinitis and/or asthma and can require treatment with topical medications, phototherapy, or in severe cases biologic injectable medication (Dupixent; Adbry) or Oral JAK inhibitors.   Continue TMC 0.1% cream QD-BID up to 5d/wk PRN. Topical steroids (such as triamcinolone , fluocinolone, fluocinonide, mometasone, clobetasol, halobetasol, betamethasone, hydrocortisone) can cause thinning and lightening of the skin if they are used for too long in the same area. Your physician has selected the right strength medicine for your problem and area affected on the body. Please use your medication only as directed by your physician to prevent side effects.   INFLAMED SEBORRHEIC KERATOSIS (5) left temple/cheek x 5 (5) Symptomatic, irritating, patient would like treated. Destruction of lesion - left temple/cheek x 5 (5) Complexity: simple   Destruction method: cryotherapy   Informed consent: discussed and consent obtained   Timeout:  patient name, date of birth, surgical site, and procedure verified Lesion destroyed using liquid nitrogen: Yes   Region frozen until ice ball extended beyond lesion: Yes   Outcome: patient tolerated procedure well with no complications   Post-procedure details:  wound care instructions given   Return in about 1 year (around 12/27/2024) for TBSE.  IRandee Busing, CMA, am acting as scribe for Celine Collard,  MD.   Documentation: I have reviewed the above documentation for accuracy and completeness, and I agree with the above.  Celine Collard, MD

## 2023-12-28 NOTE — Patient Instructions (Addendum)
 For sagging eyelids  Recommend follow up with eye doctor   OCULOPLASTIC SURGEONS (UPPER/LOWER BLEPHAROPLASTY):  Dr. Lanell Pinta in [place]  phone #[phone] Dr. Dannette Dutch in [facility]   at [facility] phone number [phone] Dr. Donnamae Gaba  Dr. Ina Manas     Cryotherapy Aftercare  Wash gently with soap and water everyday.   Apply Vaseline and Band-Aid daily until healed.   Seborrheic Keratosis  What causes seborrheic keratoses? Seborrheic keratoses are harmless, common skin growths that first appear during adult life.  As time goes by, more growths appear.  Some people may develop a large number of them.  Seborrheic keratoses appear on both covered and uncovered body parts.  They are not caused by sunlight.  The tendency to develop seborrheic keratoses can be inherited.  They vary in color from skin-colored to gray, brown, or even black.  They can be either smooth or have a rough, warty surface.   Seborrheic keratoses are superficial and look as if they were stuck on the skin.  Under the microscope this type of keratosis looks like layers upon layers of skin.  That is why at times the top layer may seem to fall off, but the rest of the growth remains and re-grows.    Treatment Seborrheic keratoses do not need to be treated, but can easily be removed in the office.  Seborrheic keratoses often cause symptoms when they rub on clothing or jewelry.  Lesions can be in the way of shaving.  If they become inflamed, they can cause itching, soreness, or burning.  Removal of a seborrheic keratosis can be accomplished by freezing, burning, or surgery. If any spot bleeds, scabs, or grows rapidly, please return to have it checked, as these can be an indication of a skin cancer.         Melanoma ABCDEs  Melanoma is the most dangerous type of skin cancer, and is the leading cause of death from skin disease.  You are more likely to develop melanoma if  you: Have light-colored skin, light-colored eyes, or red or blond hair Spend a lot of time in the sun Tan regularly, either outdoors or in a tanning bed Have had blistering sunburns, especially during childhood Have a close family member who has had a melanoma Have atypical moles or large birthmarks  Early detection of melanoma is key since treatment is typically straightforward and cure rates are extremely high if we catch it early.   The first sign of melanoma is often a change in a mole or a new dark spot.  The ABCDE system is a way of remembering the signs of melanoma.  A for asymmetry:  The two halves do not match. B for border:  The edges of the growth are irregular. C for color:  A mixture of colors are present instead of an even brown color. D for diameter:  Melanomas are usually (but not always) greater than 6mm - the size of a pencil eraser. E for evolution:  The spot keeps changing in size, shape, and color.  Please check your skin once per month between visits. You can use a small mirror in front and a large mirror behind you to keep an eye on the back side or your body.   If you see any new or changing lesions before your next follow-up, please call to schedule a visit.  Please continue daily skin protection including broad spectrum sunscreen SPF 30+ to sun-exposed areas, reapplying every 2 hours as  needed when you're outdoors.   Staying in the shade or wearing long sleeves, sun glasses (UVA+UVB protection) and wide brim hats (4-inch brim around the entire circumference of the hat) are also recommended for sun protection.    Due to recent changes in healthcare laws, you may see results of your pathology and/or laboratory studies on MyChart before the doctors have had a chance to review them. We understand that in some cases there may be results that are confusing or concerning to you. Please understand that not all results are received at the same time and often the doctors may  need to interpret multiple results in order to provide you with the best plan of care or course of treatment. Therefore, we ask that you please give us  2 business days to thoroughly review all your results before contacting the office for clarification. Should we see a critical lab result, you will be contacted sooner.   If You Need Anything After Your Visit  If you have any questions or concerns for your doctor, please call our main line at [phone] and press option 4 to reach your doctor's medical assistant. If no one answers, please leave a voicemail as directed and we will return your call as soon as possible. Messages left after 4 pm will be answered the following business day.   You may also send us  a message via MyChart. We typically respond to MyChart messages within 1-2 business days.  For prescription refills, please ask your pharmacy to contact our office. Our fax number is [phone].  If you have an urgent issue when the clinic is closed that cannot wait until the next business day, you can page your doctor at the number below.    Please note that while we do our best to be available for urgent issues outside of office hours, we are not available 24/7.   If you have an urgent issue and are unable to reach us , you may choose to seek medical care at your doctor's office, retail clinic, urgent care center, or emergency room.  If you have a medical emergency, please immediately call 911 or go to the emergency department.  Pager Numbers  - Dr. Bary Likes: [phone]  - Dr. Annette Barters: [phone]  - Dr. Felipe Horton: [phone]   In the event of inclement weather, please call our main line at [phone] for an update on the status of any delays or closures.  Dermatology Medication Tips: Please keep the boxes that topical medications come in in order to help keep track of the instructions about where and how to use these. Pharmacies typically print the medication instructions  only on the boxes and not directly on the medication tubes.   If your medication is too expensive, please contact our office at [phone] option 4 or send us  a message through MyChart.   We are unable to tell what your co-pay for medications will be in advance as this is different depending on your insurance coverage. However, we may be able to find a substitute medication at lower cost or fill out paperwork to get insurance to cover a needed medication.   If a prior authorization is required to get your medication covered by your insurance company, please allow us  1-2 business days to complete this process.  Drug prices often vary depending on where the prescription is filled and some pharmacies may offer cheaper prices.  The website www.goodrx.com contains coupons for medications through different pharmacies. The prices here do not account for what  the cost may be with help from insurance (it may be cheaper with your insurance), but the website can give you the price if you did not use any insurance.  - You can print the associated coupon and take it with your prescription to the pharmacy.  - You may also stop by our office during regular business hours and pick up a GoodRx coupon card.  - If you need your prescription sent electronically to a different pharmacy, notify our office through [facility] or by phone at [phone] option 4.     Si Usted Necesita Algo Despus de Su Visita  Tambin puede enviarnos un mensaje a travs de Clinical cytogeneticist. Por lo general respondemos a los mensajes de MyChart en el transcurso de 1 a 2 das hbiles.  Para renovar recetas, por favor pida a su farmacia que se ponga en contacto con nuestra oficina. Franz Jacks de fax es Theresa [phone].  Si tiene un asunto urgente cuando la clnica est cerrada y que no puede esperar hasta el siguiente da hbil, puede llamar/localizar a su doctor(a) al nmero que aparece a continuacin.   Por favor, tenga en  cuenta que aunque hacemos todo lo posible para estar disponibles para asuntos urgentes fuera del horario de [place], no estamos disponibles las 24 horas del da, los 7 [address]  [address] de la [place].   Si tiene un problema urgente y no puede comunicarse con nosotros, puede optar por buscar atencin mdica  en el consultorio de su doctor(a), en una clnica privada, en un centro de atencin urgente o en una sala de emergencias.  Si tiene Engineer, drilling, por favor llame inmediatamente al 911 o vaya a la sala de emergencias.  Nmeros de bper  - Dr. Bary Likes: [phone]  - Dra. Annette Barters: [phone]  - Dr. Felipe Horton: [phone]   En caso de inclemencias del tiempo, por favor llame a Lajuan Pila principal al [phone] para una actualizacin sobre el [place] de cualquier retraso o cierre.  Consejos para la medicacin en dermatologa: Por favor, guarde las cajas en las que vienen los medicamentos de uso tpico para ayudarle a seguir las instrucciones sobre dnde y cmo usarlos. Las farmacias generalmente imprimen las instrucciones del medicamento slo en las cajas y no directamente en los tubos del [place].   Si su medicamento es muy caro, por favor, pngase en contacto con Bettyjane Brunet llamando al [phone] y presione la opcin 4 o envenos un mensaje a travs de Clinical cytogeneticist.   No podemos decirle cul ser su copago por los medicamentos por adelantado ya que esto es diferente dependiendo de la cobertura de su seguro. Sin embargo, es posible que podamos encontrar un medicamento sustituto a Audiological scientist un formulario para que el seguro cubra el medicamento que se considera necesario.   Si se requiere una autorizacin previa para que su compaa de seguros [place] su medicamento, por favor permtanos de 1 a 2 das hbiles para completar este proceso.  Los precios de los medicamentos varan con frecuencia dependiendo del Environmental consultant de dnde se surte la receta y alguna farmacias pueden ofrecer  precios ms baratos.  El sitio web www.goodrx.com tiene cupones para medicamentos de Health and safety inspector. Los precios aqu no tienen en cuenta lo que podra costar con la ayuda del seguro (puede ser ms barato con su seguro), pero el sitio web puede darle el precio si no utiliz Tourist information centre manager.  - Puede imprimir el cupn correspondiente y llevarlo con su receta a la farmacia.  - Tambin puede  pasar por nuestra oficina durante el horario de atencin regular y Education officer, museum una tarjeta de cupones de GoodRx.  - Si necesita que su receta se enve electrnicamente a una farmacia diferente, informe a nuestra oficina a travs de MyChart de [place] o por telfono llamando al [phone] y presione la opcin 4.

## 2023-12-29 DIAGNOSIS — R002 Palpitations: Secondary | ICD-10-CM | POA: Diagnosis not present

## 2023-12-29 DIAGNOSIS — I10 Essential (primary) hypertension: Secondary | ICD-10-CM | POA: Diagnosis not present

## 2023-12-29 DIAGNOSIS — Z87891 Personal history of nicotine dependence: Secondary | ICD-10-CM | POA: Diagnosis not present

## 2023-12-29 DIAGNOSIS — E7800 Pure hypercholesterolemia, unspecified: Secondary | ICD-10-CM | POA: Diagnosis not present

## 2023-12-29 DIAGNOSIS — I2510 Atherosclerotic heart disease of native coronary artery without angina pectoris: Secondary | ICD-10-CM | POA: Diagnosis not present

## 2023-12-29 DIAGNOSIS — M109 Gout, unspecified: Secondary | ICD-10-CM | POA: Diagnosis not present

## 2023-12-29 DIAGNOSIS — N183 Chronic kidney disease, stage 3 unspecified: Secondary | ICD-10-CM | POA: Diagnosis not present

## 2023-12-29 DIAGNOSIS — J439 Emphysema, unspecified: Secondary | ICD-10-CM | POA: Diagnosis not present

## 2023-12-29 DIAGNOSIS — R7303 Prediabetes: Secondary | ICD-10-CM | POA: Diagnosis not present

## 2024-02-07 IMAGING — CT CT CHEST LUNG CANCER SCREENING LOW DOSE W/O CM
2 of 5 series · 15 of 40 positions shown, 18 images · non-contrast
Comparison: Lung cancer screening CT dated September 12, 2020

CLINICAL DATA: Former smoker with 47 pack-year history



[Series 3: lung 1.00 · axial · 0.66mm/px · z∈[-1200,-904]mm · 12 of 326 slices shown, 15 images]
[im 15/326  mediastinal]
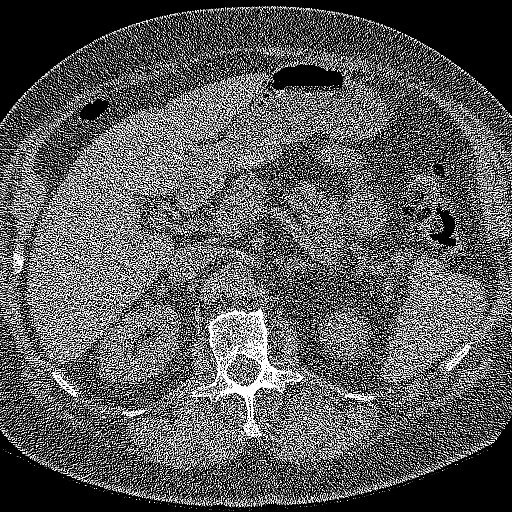
[im 15/326  lung]
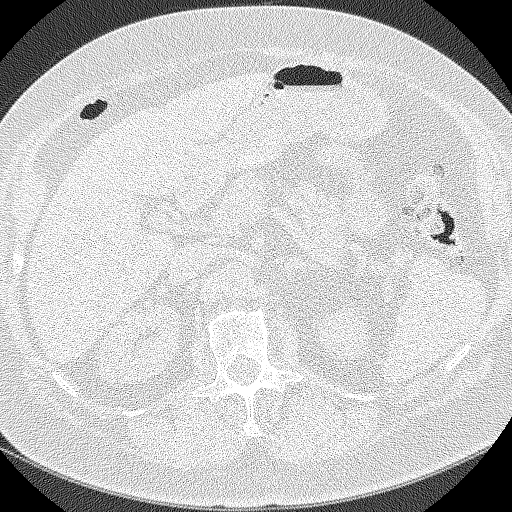
[im 45/326  lung]
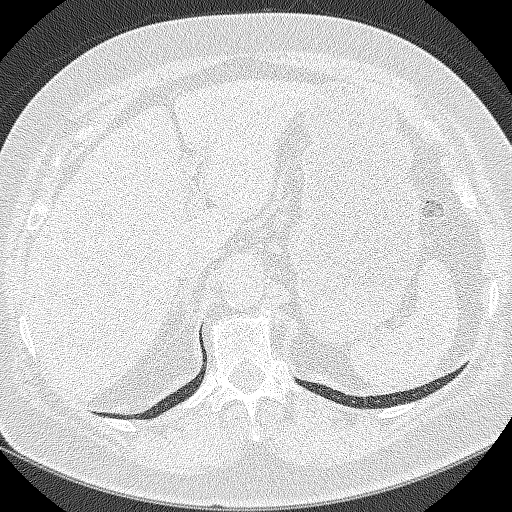
[im 74/326  lung]
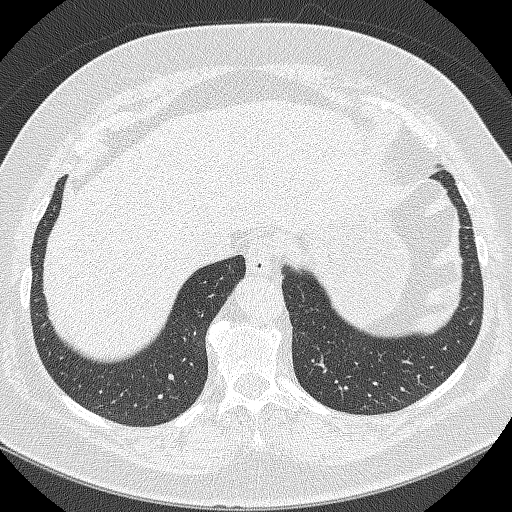
[im 104/326  lung]
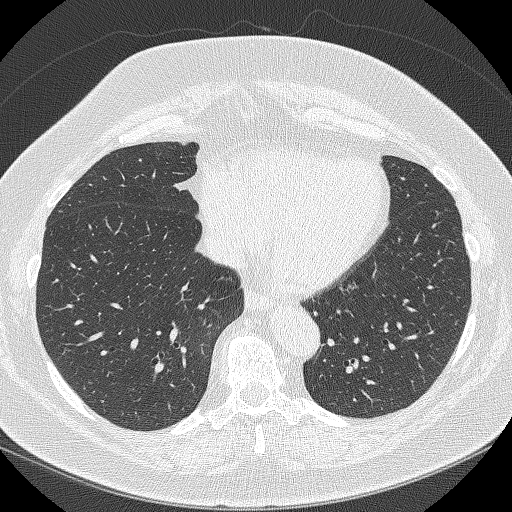
[im 119/326  mediastinal]
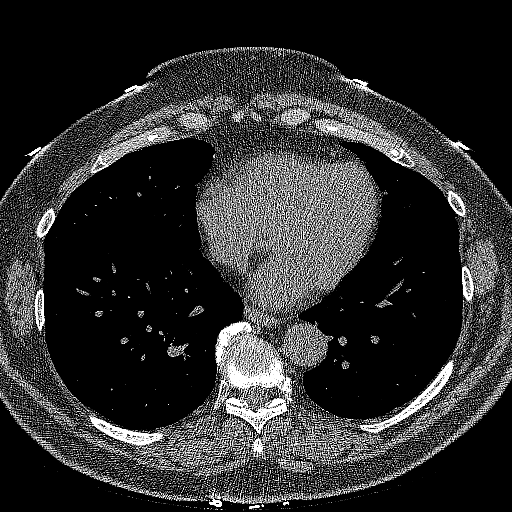
[im 119/326  lung]
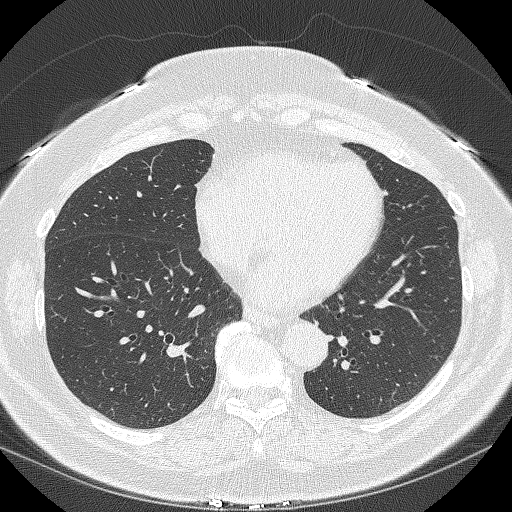
[im 148/326  lung]
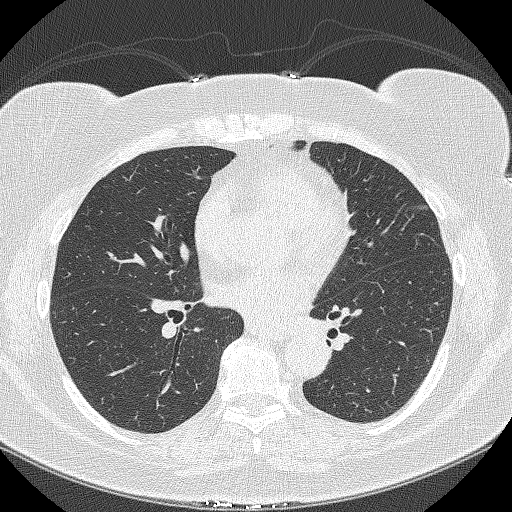
[im 178/326  lung]
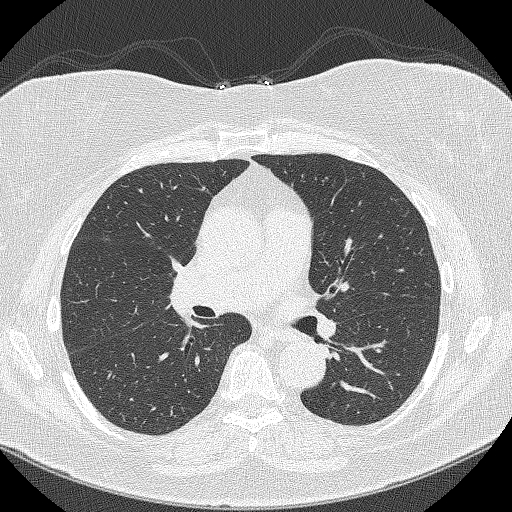
[im 207/326  lung]
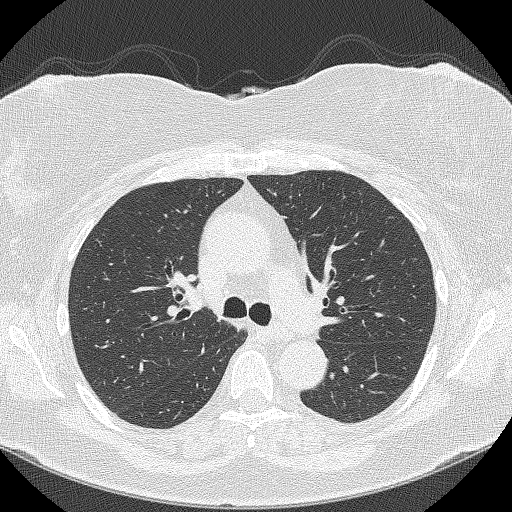
[im 222/326  mediastinal]
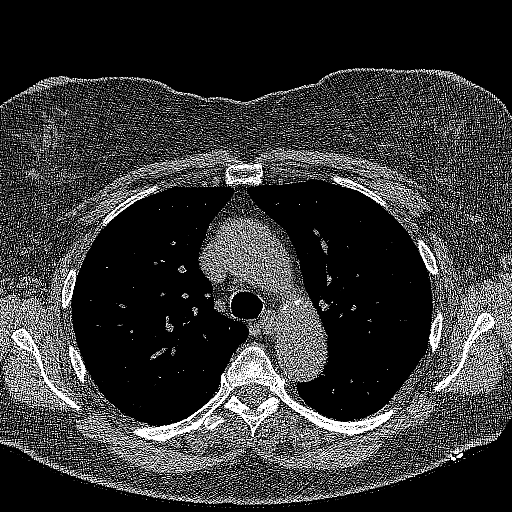
[im 222/326  lung]
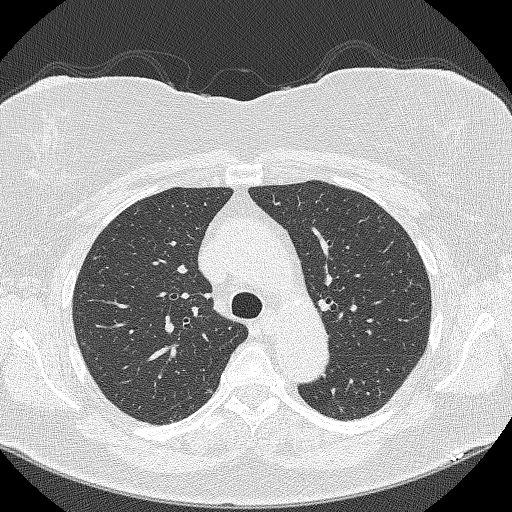
[im 252/326  lung]
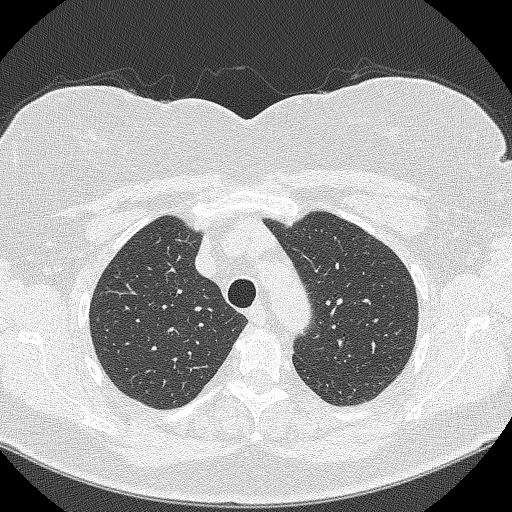
[im 281/326  lung]
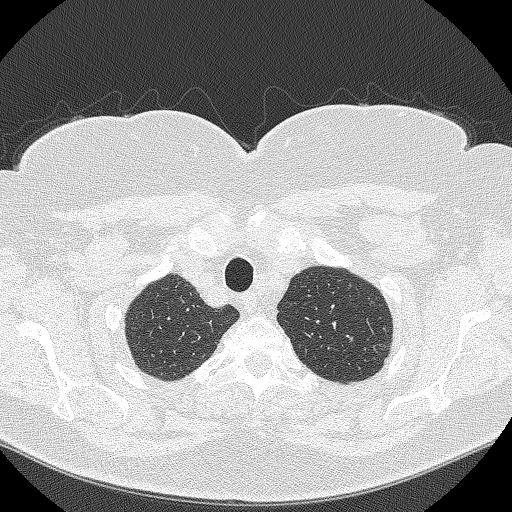
[im 311/326  lung]
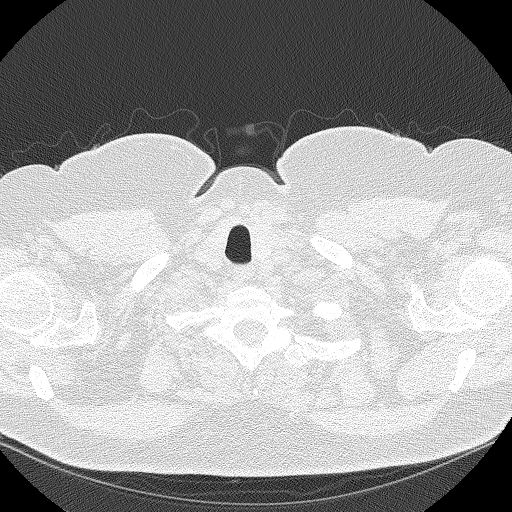

[Series 5: coronals lung 1.00 cor · coronal · 0.64mm/px · 3 of 339 slices shown]
[im 68/339  lung]
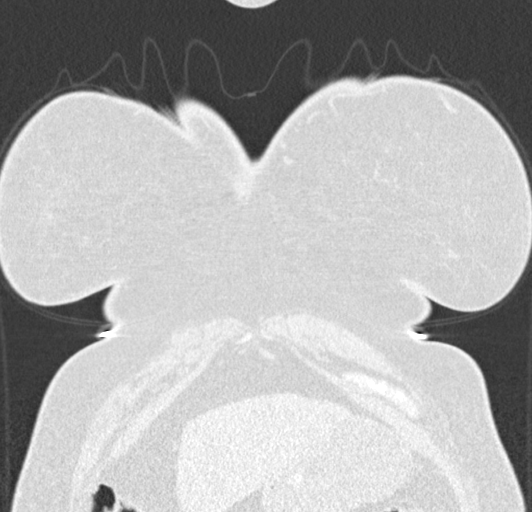
[im 136/339  lung]
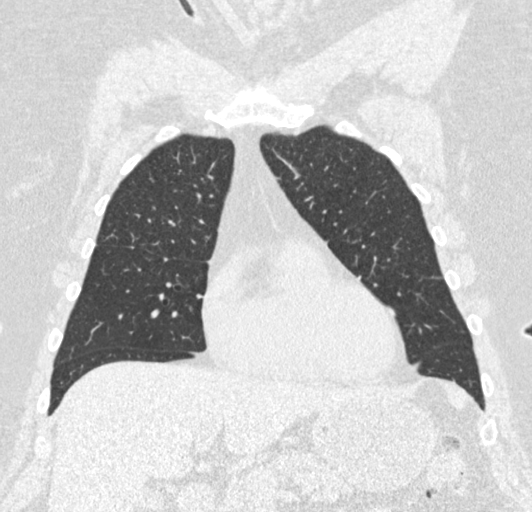
[im 203/339  lung]
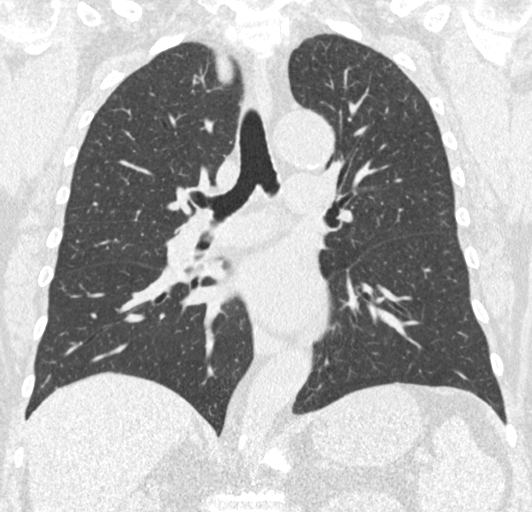

[15 of 40 positions shown; findings below may reference images not displayed]

FINDINGS: Cardiovascular: Normal heart size. No pericardial effusion. Mild
coronary artery calcifications. Mild atherosclerotic disease of the
thoracic aorta. Ascending thoracic aorta is upper limits of normal
in size, measuring 3.8 cm.

Mediastinum/Nodes: Esophagus thyroid are unremarkable. No
pathologically enlarged lymph nodes seen in the chest.

Lungs/Pleura: Central airways are patent. No consolidation, pleural
effusion or pneumothorax. New small small solid pulmonary nodule of
the left upper lobe measuring 2.1 mm on image 138. Previously seen
solid and ground-glass pulmonary nodules are stable.

Upper Abdomen: No acute abnormality.

Musculoskeletal: No chest wall mass or suspicious bone lesions
identified.
IMPRESSION: 1. Lung-RADS 2, benign appearance or behavior. Continue annual
screening with low-dose chest CT without contrast in 12 months.
2. Aortic Atherosclerosis ([id number]).

## 2024-02-14 DIAGNOSIS — Z9181 History of falling: Secondary | ICD-10-CM | POA: Diagnosis not present

## 2024-02-14 DIAGNOSIS — Z1331 Encounter for screening for depression: Secondary | ICD-10-CM | POA: Diagnosis not present

## 2024-02-14 DIAGNOSIS — Z0000 Encounter for general adult medical examination without abnormal findings: Secondary | ICD-10-CM | POA: Diagnosis not present

## 2024-05-08 DIAGNOSIS — Z23 Encounter for immunization: Secondary | ICD-10-CM | POA: Diagnosis not present

## 2024-07-03 DIAGNOSIS — R7303 Prediabetes: Secondary | ICD-10-CM | POA: Diagnosis not present

## 2024-07-03 DIAGNOSIS — I10 Essential (primary) hypertension: Secondary | ICD-10-CM | POA: Diagnosis not present

## 2024-07-03 DIAGNOSIS — M109 Gout, unspecified: Secondary | ICD-10-CM | POA: Diagnosis not present

## 2024-07-05 DIAGNOSIS — I10 Essential (primary) hypertension: Secondary | ICD-10-CM | POA: Diagnosis not present

## 2024-07-05 DIAGNOSIS — E7800 Pure hypercholesterolemia, unspecified: Secondary | ICD-10-CM | POA: Diagnosis not present

## 2024-07-05 DIAGNOSIS — N183 Chronic kidney disease, stage 3 unspecified: Secondary | ICD-10-CM | POA: Diagnosis not present

## 2024-07-05 DIAGNOSIS — I2510 Atherosclerotic heart disease of native coronary artery without angina pectoris: Secondary | ICD-10-CM | POA: Diagnosis not present

## 2024-07-05 DIAGNOSIS — Z23 Encounter for immunization: Secondary | ICD-10-CM | POA: Diagnosis not present

## 2024-07-05 DIAGNOSIS — J439 Emphysema, unspecified: Secondary | ICD-10-CM | POA: Diagnosis not present

## 2024-07-05 DIAGNOSIS — R7303 Prediabetes: Secondary | ICD-10-CM | POA: Diagnosis not present

## 2024-07-05 DIAGNOSIS — M109 Gout, unspecified: Secondary | ICD-10-CM | POA: Diagnosis not present

## 2024-07-05 DIAGNOSIS — Z87891 Personal history of nicotine dependence: Secondary | ICD-10-CM | POA: Diagnosis not present

## 2024-07-05 DIAGNOSIS — R002 Palpitations: Secondary | ICD-10-CM | POA: Diagnosis not present

## 2024-08-04 NOTE — Progress Notes (Unsigned)
 "  Cardiology Clinic Note   Date: 08/06/2024 ID: DESSIE TATEM, DOB 1951/03/03, MRN [id number]  Primary Cardiologist:  Evalene Lunger, MD  Chief Complaint   Deborah Hayes is a 74 y.o. female who presents to the clinic today for routine follow up.   Patient Profile   Deborah Hayes is followed by Dr. Gollan for the history outlined below.      Past medical history significant for: Palpitations/PSVT. 14-day ZIO 04/14/2023: HR 52 to 176 bpm, average 79 bpm.  140 runs of SVT fastest 15 beats max rate 176 bpm, longest 15.4 seconds average rate 123 bpm.  Rare ectopy.  4 patient triggered events associated with normal sinus rhythm. Hypertension. Hyperlipidemia. Lipid panel 07/03/2024: LDL 87, HDL 64, TG 98, total 169. Emphysema.  Former tobacco abuse.   In summary, patient was first evaluated by Dr. Gollan on 03/16/2023 for palpitations.  Patient reported in May she was working hard in the backyard.  She was troubled by joint pain at that time and when laying in bed she experienced palpitations that felt like proximal in her chest.  Her Fitbit recorded A-fib episodes lasting several hours.  The palpitations went on all night and resolved upon awakening.  She had not experienced further episodes since that time.  She was taking Toprol 25 mg daily.  She endorsed family stressors with mother and husband with dementia and son with autism living in a facility.  She lost 50 pounds in 1 year through dietary changes utilizing weight watchers.  EKG demonstrated sinus rhythm with first-degree AV block 66 bpm.  She wore a 14-day ZIO that demonstrated runs of SVT.     History of Present Illness    Today, patient is doing well. Patient denies shortness of breath, dyspnea on exertion, lower extremity edema, orthopnea or PND. No chest pain, pressure, or tightness. She denies a recurrence of palpitations. She is under quite a bit of stress. 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 She also has an adult son with autism who lives in a facility but has been home for 2 weeks. She does stay active performing body weight exercises and balance exercises at home. She endorses recent weight gain secondary to overeating. She does not check her BP at home but has noted it seems to be trending up but still within normal range.     ROS: All other systems reviewed and are otherwise negative except as noted in History of Present Illness.  EKGs/Labs Reviewed    EKG Interpretation Date/Time:  Monday August 06 2024 10:28:08 EST Ventricular Rate:  73 PR Interval:  194 QRS Duration:  78 QT Interval:  380 QTC Calculation: 418 R Axis:   48  Text Interpretation: Normal sinus rhythm Normal ECG When compared with ECG of 16-Mar-2023 10:52, No significant change was found Confirmed by Loistine Sober [phone]) on 08/06/2024 10:30:17 AM    Physical Exam    VS:  BP 132/86 (BP Location: Left Arm, Patient Position: Sitting, Cuff Size: Large)   Pulse 73 Comment: 79 oximeter  Ht 5' 6 (1.676 m)   Wt 217 lb 6.4 oz (98.6 kg)   SpO2 97%   BMI 35.09 kg/m  , BMI Body mass index is 35.09 kg/m.  GEN: Well nourished, well developed, in no acute distress. Neck: No JVD or carotid bruits. Cardiac:  RRR.  No murmur. No rubs or gallops.   Respiratory:  Respirations regular and unlabored. Clear to auscultation without rales,  wheezing or rhonchi. GI: Soft, nontender, nondistended. Extremities: Radials/DP/PT 2+ and equal bilaterally. No clubbing or cyanosis. No edema   Skin: Warm and dry, no rash. Neuro: Strength intact.  Assessment & Plan   Palpitations/PSVT 14-day ZIO September 2024 demonstrated HR 52 to 176 bpm, average 79 bpm, 140 runs of SVT fastest 15 beats max rate 176 bpm, longest 15.4 seconds average rate 123 bpm, rare ectopy.  Patient denies palpitations. EKG today demonstrates NSR 73 bpm.  - Continue Toprol.  Hypertension BP today 140/90 on intake and 132/86 on my recheck.  Instructed patient to check BP 2-3 times a week at home. Contact office if BP continues to be >130/80. - Continue losartan, Toprol.  Hyperlipidemia LDL 87 December 2025, at [place].  - Continue atorvastatin.  Disposition: Return in 1 year or sooner as needed.          Signed, Barnie HERO. Jeraldean Wechter, DNP, NP-C  "

## 2024-08-06 ENCOUNTER — Ambulatory Visit: Attending: Student | Admitting: Student

## 2024-08-06 ENCOUNTER — Encounter: Payer: Self-pay | Admitting: Student

## 2024-08-06 VITALS — BP 132/86 | HR 73 | Ht 66.0 in | Wt 217.4 lb

## 2024-08-06 DIAGNOSIS — E7800 Pure hypercholesterolemia, unspecified: Secondary | ICD-10-CM

## 2024-08-06 DIAGNOSIS — I471 Supraventricular tachycardia, unspecified: Secondary | ICD-10-CM | POA: Diagnosis not present

## 2024-08-06 DIAGNOSIS — R002 Palpitations: Secondary | ICD-10-CM | POA: Diagnosis not present

## 2024-08-06 DIAGNOSIS — I10 Essential (primary) hypertension: Secondary | ICD-10-CM | POA: Diagnosis not present

## 2024-08-06 NOTE — Patient Instructions (Signed)
 Medication Instructions:   Your physician recommends that you continue on your current medications as directed. Please refer to the Current Medication list given to you today.   *If you need a refill on your cardiac medications before your next appointment, please call your pharmacy*  Lab Work: No labs ordered today  If you have labs (blood work) drawn today and your tests are completely normal, you will receive your results only by: MyChart Message (if you have MyChart) OR A paper copy in the mail If you have any lab test that is abnormal or we need to change your treatment, we will call you to review the results.  Testing/Procedures: No test ordered today   Follow-Up: At [facility], you and your health needs are our priority.  As part of our continuing mission to provide you with exceptional heart care, our providers are all part of one team.  This team includes your primary Cardiologist (physician) and Advanced Practice Providers or APPs (Physician Assistants and Nurse Practitioners) who all work together to provide you with the care you need, when you need it.  Your next appointment:   1 year(s)  Provider:   You may see Timothy Gollan, MD  or one of the following Advanced Practice Providers on your designated Care Team:   Laneta Pintos, NP Gildardo Labrador, PA-C Varney Gentleman, PA-C Cadence Alta Sierra, PA-C Ronald Cockayne, NP Morey Ar, NP    We recommend signing up for the patient portal called MyChart.  Sign up information is provided on this After Visit Summary.  MyChart is used to connect with patients for Virtual Visits (Telemedicine).  Patients are able to view lab/test results, encounter notes, upcoming appointments, etc.  Non-urgent messages can be sent to your provider as well.   To learn more about what you can do with MyChart, go to [email or web address].

## 2025-01-03 ENCOUNTER — Ambulatory Visit: Admitting: Dermatology
